# Patient Record
Sex: Male | Born: 1935 | Race: White | Hispanic: No | Marital: Married | Smoking: Former smoker
Health system: Southern US, Community
[De-identification: ages and names within clinical notes are randomized; demographics above are authoritative.]

## PROBLEM LIST (undated history)

## (undated) DIAGNOSIS — R51 Headache: Secondary | ICD-10-CM

## (undated) DIAGNOSIS — N189 Chronic kidney disease, unspecified: Secondary | ICD-10-CM

## (undated) DIAGNOSIS — I200 Unstable angina: Secondary | ICD-10-CM

## (undated) DIAGNOSIS — E785 Hyperlipidemia, unspecified: Secondary | ICD-10-CM

## (undated) DIAGNOSIS — M1990 Unspecified osteoarthritis, unspecified site: Secondary | ICD-10-CM

## (undated) DIAGNOSIS — R079 Chest pain, unspecified: Secondary | ICD-10-CM

## (undated) DIAGNOSIS — I129 Hypertensive chronic kidney disease with stage 1 through stage 4 chronic kidney disease, or unspecified chronic kidney disease: Secondary | ICD-10-CM

## (undated) DIAGNOSIS — I498 Other specified cardiac arrhythmias: Secondary | ICD-10-CM

## (undated) DIAGNOSIS — Z87891 Personal history of nicotine dependence: Secondary | ICD-10-CM

## (undated) DIAGNOSIS — E876 Hypokalemia: Secondary | ICD-10-CM

## (undated) DIAGNOSIS — I2510 Atherosclerotic heart disease of native coronary artery without angina pectoris: Secondary | ICD-10-CM

## (undated) DIAGNOSIS — N400 Enlarged prostate without lower urinary tract symptoms: Secondary | ICD-10-CM

## (undated) DIAGNOSIS — I10 Essential (primary) hypertension: Secondary | ICD-10-CM

## (undated) DIAGNOSIS — C801 Malignant (primary) neoplasm, unspecified: Secondary | ICD-10-CM

## (undated) DIAGNOSIS — R519 Headache, unspecified: Secondary | ICD-10-CM

## (undated) HISTORY — DX: Chest pain, unspecified: R07.9

## (undated) HISTORY — PX: OTHER SURGICAL HISTORY: SHX169

## (undated) HISTORY — PX: VASECTOMY: SHX75

## (undated) HISTORY — PX: EYE SURGERY: SHX253

## (undated) HISTORY — DX: Other specified cardiac arrhythmias: I49.8

## (undated) HISTORY — DX: Atherosclerotic heart disease of native coronary artery without angina pectoris: I25.10

## (undated) HISTORY — DX: Headache, unspecified: R51.9

## (undated) HISTORY — DX: Headache: R51

## (undated) HISTORY — DX: Unstable angina: I20.0

## (undated) HISTORY — DX: Personal history of nicotine dependence: Z87.891

## (undated) HISTORY — DX: Hypertensive chronic kidney disease with stage 1 through stage 4 chronic kidney disease, or unspecified chronic kidney disease: I12.9

## (undated) HISTORY — DX: Hyperlipidemia, unspecified: E78.5

## (undated) HISTORY — DX: Hypokalemia: E87.6

## (undated) HISTORY — DX: Chronic kidney disease, unspecified: N18.9

---

## 2005-03-27 ENCOUNTER — Ambulatory Visit: Payer: Self-pay | Admitting: Cardiology

## 2006-07-28 ENCOUNTER — Encounter: Payer: Self-pay | Admitting: Cardiology

## 2007-06-16 DIAGNOSIS — I2510 Atherosclerotic heart disease of native coronary artery without angina pectoris: Secondary | ICD-10-CM

## 2007-06-16 HISTORY — PX: CORONARY ANGIOPLASTY: SHX604

## 2007-06-16 HISTORY — DX: Atherosclerotic heart disease of native coronary artery without angina pectoris: I25.10

## 2008-04-09 ENCOUNTER — Encounter: Payer: Self-pay | Admitting: Cardiology

## 2008-04-10 ENCOUNTER — Encounter: Payer: Self-pay | Admitting: Cardiology

## 2008-04-25 ENCOUNTER — Encounter: Payer: Self-pay | Admitting: Cardiology

## 2008-04-25 ENCOUNTER — Encounter: Payer: Self-pay | Admitting: Physician Assistant

## 2008-04-25 ENCOUNTER — Ambulatory Visit: Payer: Self-pay | Admitting: Cardiology

## 2008-04-25 ENCOUNTER — Inpatient Hospital Stay (HOSPITAL_COMMUNITY): Admission: EM | Admit: 2008-04-25 | Discharge: 2008-04-27 | Payer: Self-pay | Admitting: Cardiology

## 2008-04-26 ENCOUNTER — Encounter: Payer: Self-pay | Admitting: Cardiology

## 2008-05-21 ENCOUNTER — Ambulatory Visit: Payer: Self-pay | Admitting: Cardiology

## 2008-05-28 ENCOUNTER — Encounter: Payer: Self-pay | Admitting: Physician Assistant

## 2008-07-25 ENCOUNTER — Encounter: Payer: Self-pay | Admitting: Cardiology

## 2008-07-25 ENCOUNTER — Ambulatory Visit: Payer: Self-pay | Admitting: Cardiology

## 2008-07-25 DIAGNOSIS — N182 Chronic kidney disease, stage 2 (mild): Secondary | ICD-10-CM | POA: Insufficient documentation

## 2008-07-25 DIAGNOSIS — I10 Essential (primary) hypertension: Secondary | ICD-10-CM | POA: Insufficient documentation

## 2008-07-25 DIAGNOSIS — I259 Chronic ischemic heart disease, unspecified: Secondary | ICD-10-CM

## 2008-08-01 ENCOUNTER — Encounter: Payer: Self-pay | Admitting: Cardiology

## 2008-11-29 ENCOUNTER — Encounter: Payer: Self-pay | Admitting: Cardiology

## 2009-02-19 ENCOUNTER — Ambulatory Visit: Payer: Self-pay | Admitting: Cardiology

## 2009-02-19 DIAGNOSIS — E785 Hyperlipidemia, unspecified: Secondary | ICD-10-CM

## 2009-04-10 ENCOUNTER — Telehealth (INDEPENDENT_AMBULATORY_CARE_PROVIDER_SITE_OTHER): Payer: Self-pay | Admitting: *Deleted

## 2009-04-10 ENCOUNTER — Encounter: Payer: Self-pay | Admitting: Cardiology

## 2009-04-11 ENCOUNTER — Encounter: Payer: Self-pay | Admitting: Cardiology

## 2009-06-19 ENCOUNTER — Encounter: Payer: Self-pay | Admitting: Cardiology

## 2009-07-19 ENCOUNTER — Encounter: Payer: Self-pay | Admitting: Physician Assistant

## 2009-07-26 ENCOUNTER — Encounter (INDEPENDENT_AMBULATORY_CARE_PROVIDER_SITE_OTHER): Payer: Self-pay | Admitting: *Deleted

## 2009-09-02 ENCOUNTER — Ambulatory Visit: Payer: Self-pay | Admitting: Cardiology

## 2010-03-25 ENCOUNTER — Ambulatory Visit: Payer: Self-pay | Admitting: Cardiology

## 2010-04-21 ENCOUNTER — Telehealth (INDEPENDENT_AMBULATORY_CARE_PROVIDER_SITE_OTHER): Payer: Self-pay | Admitting: *Deleted

## 2010-05-05 ENCOUNTER — Encounter: Payer: Self-pay | Admitting: Cardiology

## 2010-06-24 ENCOUNTER — Encounter: Payer: Self-pay | Admitting: Cardiology

## 2010-06-27 ENCOUNTER — Encounter: Payer: Self-pay | Admitting: Cardiology

## 2010-07-15 NOTE — Letter (Signed)
Summary: Engineer, materials at [facility]  [address]   [place], [place] [address]   Phone: [phone]  Fax: [phone]        July 26, 2009 MRN: [id number]   [facility] [address] [place], [place]  [address]   Dear Mr. WORD,  Your test ordered by Selena Batten has been reviewed by your physician (or physician assistant) and was found to be normal or stable. Your physician (or physician assistant) felt no changes were needed at this time.  ____ Echocardiogram  ____ Cardiac Stress Test  __X__ Lab Work  ____ Peripheral vascular study of arms, legs or neck  ____ CT scan or X-ray  ____ Lung or Breathing test  ____ Other:   Thank you.   Hoover Brunette, LPN    Duane Boston, M.D., F.A.C.C. Thressa Sheller, M.D., F.A.C.C. Oneal Grout, M.D., F.A.C.C. Cheree Ditto, M.D., F.A.C.C. Daiva Nakayama, M.D., F.A.C.C. Kenney Houseman, M.D., F.A.C.C. Jeanne Ivan, PA-C

## 2010-07-15 NOTE — Assessment & Plan Note (Signed)
Summary: 6 MO FU REMINDER-SRS  Medications Added ASPIRIN 325 MG TABS (ASPIRIN) take one tablet by mouth once a day        Visit Type:  Follow-up Primary Provider:  Dr Dimas Aguas  CC:  follow-up visit.  History of Present Illness: the patient is 77 year old male with history of two-vessel coronary disease status post stent placement. The patient has normal LV function. He's currently taking care of his wife who has cancer.  The patient denies any chest pain. He has no orthopnea PND he has no palpitations or syncope. He remained stable from a cardiovascular perspective. Lipid panels followed by Dr. Dimas Aguas.this  His blood pressure poorly controlled today in the office. He reports that his readings have been good at home. The patient just returned from a four-hour trip to the hospital for a bone scan for his wife.  Clinical Review Panels:  Cardiac Imaging Cardiac Cath Findings severe circumflex and right coronary stenosis. Normal right heart where pressures. Patient underwent U. thus position the right coronary artery and circumflex by Dr. Excell Seltzer (04/27/2008)    Preventive Screening-Counseling & Management  Alcohol-Tobacco     Smoking Status: quit     Year Quit: 1995  Current Medications (verified): 1)  Flomax 0.4 Mg Xr24h-Cap (Tamsulosin Hcl) .... Every 3-4 Days 2)  Atenolol 50 Mg Tabs (Atenolol) .... One Tablet P.o. Q. Day 3)  Aspirin 81 Mg Tbec (Aspirin) .... Take One Tablet By Mouth Daily 4)  Hydrochlorothiazide 25 Mg Tabs (Hydrochlorothiazide) .... Take One Tablet By Mouth Daily. 5)  Lisinopril 20 Mg Tabs (Lisinopril) .... One Tablet P.o. Q. Day 6)  Nitroglycerin 0.4 Mg Subl (Nitroglycerin) .... One Tablet Under Tongue Every 5 Minutes As Needed For Chest Pain---May Repeat Times Three 7)  Simvastatin 40 Mg Tabs (Simvastatin) .... Take 1 Tablet By Mouth Once A Day Have Flp/lft in 12wks of Starting 8)  Aspirin 325 Mg Tabs (Aspirin) .... Take One Tablet By Mouth Once A  Day  Allergies: 1)  Penicillin  Comments:  Nurse/Medical Assistant: The patient's medications and allergies were reviewed with the patient and were updated in the Medication and Allergy Lists. Youlanda Mighty RN (September 02, 2009 2:46 PM)  Past History:  Past Medical History: Last updated: 02/19/2009 2 vessel coronary artery disease Status post  stenting of the circumflex and right coronary artery Left ventriculogram deferred History of preserved LV function Mild chronic renal insufficiency creatinine 1.3 Hypertension uncontrolled Dyslipidemia Mild hypokalemia  Family History: Last updated: 07/25/2008 noncontributory  Social History: Last updated: 07/25/2008 the patient does not smoke or drink he is married  Risk Factors: Smoking Status: quit (09/02/2009)  Social History: Smoking Status:  quit  Vital Signs:  Patient profile:   77 year old male Height:      68 inches Weight:      181 pounds Pulse rate:   52 / minute BP sitting:   172 / 81  (left arm) Cuff size:   regular  Vitals Entered By: Youlanda Mighty RN (September 02, 2009 2:41 PM) CC: follow-up visit   Physical Exam  Additional Exam:  General: Well-developed, well-nourished in no distress head: Normocephalic and atraumatic eyes PERRLA/EOMI intact, conjunctiva and lids normal nose: No deformity or lesions mouth normal dentition, normal posterior pharynx neck: Supple, no JVD.  No masses, thyromegaly or abnormal cervical nodes lungs: Normal breath sounds bilaterally without wheezing.  Normal percussion heart: regular rate and rhythm with normal S1 and S2, no S3 or S4.  PMI is normal.  No  pathological murmurs abdomen: Normal bowel sounds, abdomen is soft and nontender without masses, organomegaly or hernias noted.  No hepatosplenomegaly musculoskeletal: Back normal, normal gait muscle strength and tone normal pulsus: Pulse is normal in all 4 extremities Extremities: No peripheral pitting  edema neurologic: Alert and oriented x 3 skin: Intact without lesions or rashes cervical nodes: No significant adenopathy psychologic: Normal affect    Impression & Recommendations:  Problem # 1:  ESSENTIAL HYPERTENSION, BENIGN (ICD-401.1) blood pressure is poorly controlled today but the patient reassures me that his readings are otherwise within normal range. He just returned from the hospital with his wife. He states he was stressed out because of the long stay. I did ask him to check his blood pressures in the next couple of days carefully. His updated medication list for this problem includes:    Atenolol 50 Mg Tabs (Atenolol) ..... One tablet p.o. q. day    Aspirin 81 Mg Tbec (Aspirin) .Marland Kitchen... Take one tablet by mouth daily    Hydrochlorothiazide 25 Mg Tabs (Hydrochlorothiazide) .Marland Kitchen... Take one tablet by mouth daily.    Lisinopril 20 Mg Tabs (Lisinopril) ..... One tablet p.o. q. day    Aspirin 325 Mg Tabs (Aspirin) .Marland Kitchen... Take one tablet by mouth once a day  Problem # 2:  CORONARY ARTERY DISEASE, S/P PTCA (ICD-414.9) patient reports no recurrent chest pain. It is no clinical evidence of ischemia. Plavix has been discontinued. The following medications were removed from the medication list:    Plavix 75 Mg Tabs (Clopidogrel bisulfate) ..... One tablet p.o. q. day His updated medication list for this problem includes:    Atenolol 50 Mg Tabs (Atenolol) ..... One tablet p.o. q. day    Aspirin 81 Mg Tbec (Aspirin) .Marland Kitchen... Take one tablet by mouth daily    Lisinopril 20 Mg Tabs (Lisinopril) ..... One tablet p.o. q. day    Nitroglycerin 0.4 Mg Subl (Nitroglycerin) ..... One tablet under tongue every 5 minutes as needed for chest pain---may repeat times three    Aspirin 325 Mg Tabs (Aspirin) .Marland Kitchen... Take one tablet by mouth once a day  Problem # 3:  RENAL DISEASE, CHRONIC, STAGE II (ICD-585.2) Assessment: Comment Only  Patient Instructions: 1)  Your physician recommends that you continue on  your current medications as directed. Please refer to the Current Medication list given to you today. 2)  Follow up in  6 months

## 2010-07-15 NOTE — Progress Notes (Signed)
Summary: clearance for cataract surgery    Phone Note Other Incoming Call back at [phone]   Caller: PATIENT WALK-IN Summary of Call: Requesting clearance for cataract surgery with Dr. Ruben Gottron - Optometrist at [facility].   Hoover Brunette, LPN  April 21, 2010 2:22 PM   Follow-up for Phone Call        Patient can be cleared for cataract surgery- low risk.  Follow-up by: Lewayne Bunting, MD, [facility],  April 21, 2010 4:45 PM  Additional Follow-up for Phone Call Additional follow up Details #1::        Patient notified.   States he has appt. with surgeon in the morning for consult.  Asked him to have them fax [place] a clearance request and I will fax back.  Patient verbalized understanding.  Hoover Brunette, LPN  April 22, 2010 2:45 PM     Additional Follow-up for Phone Call Additional follow up Details #2::    Spoke with patient.  Have not received anything yet.  Patient will call them to request they fax again. Hoover Brunette, LPN  May 02, 2010 9:48 AM   Form received.  Will fax back today. Hoover Brunette, LPN  May 05, 2010 11:01 AM

## 2010-07-15 NOTE — Letter (Signed)
Summary: External Correspondence/ OFFICE NOTE DR. HOWARD  External Correspondence/ OFFICE NOTE DR. HOWARD   Imported By: Dorise Hiss 06/25/2009 11:31:51  _____________________________________________________________________  External Attachment:    Type:   Image     Comment:   External Document

## 2010-07-15 NOTE — Assessment & Plan Note (Signed)
Summary: 6 mo fu per sept reminder -srs  Medications Added FLOMAX 0.4 MG XR24H-CAP (TAMSULOSIN HCL) every 2-3 days      Allergies Added:   Visit Type:  Follow-up Primary Provider:  Dr Dimas Aguas   History of Present Illness: the patient is a 77 year old male with history of two-vessel coronary arteries status post stent placement. He has normal LV function. Unfortunately his wife died of lung cancer several months ago. He is quite tearful.  From a cardiovascular standpoint he is doing well. He denies any chest pain shortness of breath orthopnea PND. Health care maintenance as well as monitoring of his lipid panels as provided by Dr. Dimas Aguas. Last office visit he was hypertensive and his blood pressure is now under control.  The patient denies any chest pain, palpitations or syncope  Preventive Screening-Counseling & Management  Alcohol-Tobacco     Smoking Status: quit     Year Quit: 1995  Current Medications (verified): 1)  Flomax 0.4 Mg Xr24h-Cap (Tamsulosin Hcl) .... Every 2-3 Days 2)  Atenolol 50 Mg Tabs (Atenolol) .... One Tablet P.o. Q. Day 3)  Aspirin 81 Mg Tbec (Aspirin) .... Take One Tablet By Mouth Daily 4)  Hydrochlorothiazide 25 Mg Tabs (Hydrochlorothiazide) .... Take One Tablet By Mouth Daily. 5)  Lisinopril 20 Mg Tabs (Lisinopril) .... One Tablet P.o. Q. Day 6)  Nitroglycerin 0.4 Mg Subl (Nitroglycerin) .... One Tablet Under Tongue Every 5 Minutes As Needed For Chest Pain---May Repeat Times Three 7)  Simvastatin 40 Mg Tabs (Simvastatin) .... Take 1 Tablet By Mouth Once A Day Have Flp/lft in 12wks of Starting 8)  Aspirin 325 Mg Tabs (Aspirin) .... Take One Tablet By Mouth Once A Day  Allergies (verified): 1)  Penicillin  Comments:  Nurse/Medical Assistant: The patient's medication bottles and allergies were reviewed with the patient and were updated in the Medication and Allergy Lists.  Past History:  Family History: Last updated:  07/25/2008 noncontributory  Social History: Last updated: 07/25/2008 the patient does not smoke or drink he is married  Risk Factors: Smoking Status: quit (03/25/2010)  Past Medical History: 2 vessel coronary artery disease Status post  stenting of the circumflex and right coronary artery2009 Left ventriculogram deferred History of preserved LV function Mild chronic renal insufficiency creatinine 1.3 Hypertension uncontrolled Dyslipidemia Mild hypokalemia  Vital Signs:  Patient profile:   77 year old male Height:      68 inches Weight:      177 pounds BMI:     27.01 Pulse rate:   62 / minute BP sitting:   135 / 70  (left arm) Cuff size:   regular  Vitals Entered By: Carlye Grippe (March 25, 2010 9:31 AM)  Nutrition Counseling: Patient's BMI is greater than 25 and therefore counseled on weight management options.  Physical Exam  Additional Exam:  General: Well-developed, well-nourished in no distress head: Normocephalic and atraumatic eyes PERRLA/EOMI intact, conjunctiva and lids normal nose: No deformity or lesions mouth normal dentition, normal posterior pharynx neck: Supple, no JVD.  No masses, thyromegaly or abnormal cervical nodes lungs: Normal breath sounds bilaterally without wheezing.  Normal percussion heart: regular rate and rhythm with normal S1 and S2, no S3 or S4.  PMI is normal.  No pathological murmurs abdomen: Normal bowel sounds, abdomen is soft and nontender without masses, organomegaly or hernias noted.  No hepatosplenomegaly musculoskeletal: Back normal, normal gait muscle strength and tone normal pulsus: Pulse is normal in all 4 extremities Extremities: No peripheral pitting edema neurologic: Alert and  oriented x 3 skin: Intact without lesions or rashes cervical nodes: No significant adenopathy psychologic: Normal affect    EKG  Procedure date:  03/25/2010  Findings:      normal sinus rhythm no acute abnormalities  Impression &  Recommendations:  Problem # 1:  DYSLIPIDEMIA (ICD-272.4) followed by Dr. Dimas Aguas His updated medication list for this problem includes:    Simvastatin 40 Mg Tabs (Simvastatin) .Marland Kitchen... Take 1 tablet by mouth once a day have flp/lft in 12wks of starting  Problem # 2:  ESSENTIAL HYPERTENSION, BENIGN (ICD-401.1) blood pressure controlled continue current medical regimen His updated medication list for this problem includes:    Atenolol 50 Mg Tabs (Atenolol) ..... One tablet p.o. q. day    Aspirin 81 Mg Tbec (Aspirin) .Marland Kitchen... Take one tablet by mouth daily    Hydrochlorothiazide 25 Mg Tabs (Hydrochlorothiazide) .Marland Kitchen... Take one tablet by mouth daily.    Lisinopril 20 Mg Tabs (Lisinopril) ..... One tablet p.o. q. day    Aspirin 325 Mg Tabs (Aspirin) .Marland Kitchen... Take one tablet by mouth once a day  Problem # 3:  CORONARY ARTERY DISEASE, S/P PTCA (ICD-414.9) no recurrent chest pain. Status post coronary intervention in 2009 to the right coronary artery and circumflex. The patient has not required any nitroglycerin. His electrocardiogram is within normal limits. His updated medication list for this problem includes:    Atenolol 50 Mg Tabs (Atenolol) ..... One tablet p.o. q. day    Aspirin 81 Mg Tbec (Aspirin) .Marland Kitchen... Take one tablet by mouth daily    Lisinopril 20 Mg Tabs (Lisinopril) ..... One tablet p.o. q. day    Nitroglycerin 0.4 Mg Subl (Nitroglycerin) ..... One tablet under tongue every 5 minutes as needed for chest pain---may repeat times three    Aspirin 325 Mg Tabs (Aspirin) .Marland Kitchen... Take one tablet by mouth once a day  Orders: EKG w/ Interpretation (93000)  Patient Instructions: 1)  Your physician recommends that you continue on your current medications as directed. Please refer to the Current Medication list given to you today. 2)  Follow up in  1 year

## 2010-07-15 NOTE — Letter (Signed)
Summary: Letter/ [facility]  Letter/ [facility]   Imported By: Dorise Hiss 05/14/2010 10:30:54  _____________________________________________________________________  External Attachment:    Type:   Image     Comment:   External Document

## 2010-07-17 NOTE — Letter (Signed)
Summary: External Correspondence/ OFFICE NOTE DR. HOWARD  External Correspondence/ OFFICE NOTE DR. HOWARD   Imported By: Dorise Hiss 07/03/2010 14:15:55  _____________________________________________________________________  External Attachment:    Type:   Image     Comment:   External Document

## 2010-10-28 NOTE — Cardiovascular Report (Signed)
[name], [name]             ACCOUNT NO.:  [id number]   MEDICAL RECORD NO.:  [id number]          PATIENT TYPE:  INP   LOCATION:  6524                         FACILITY:  [facility]   PHYSICIAN:  Rollene Rotunda, MD, FACCDATE OF BIRTH:  06-Mar-1928   DATE OF PROCEDURE:  04/25/2008  DATE OF DISCHARGE:                            CARDIAC CATHETERIZATION   PRIMARY CARE PHYSICIAN:  Kirstie Peri, MD   CARDIOLOGIST:  Everardo Beals. Juanda Chance, MD, [facility]   PROCEDURE:  Left and right heart catheterization/ coronary  arteriography.   INDICATIONS:  The patient with dyspnea and chest pains suggestive of  unstable angina versus heart failure.   PROCEDURE NOTE:  Left heart catheterization was performed in the right  femoral artery and right heart catheterization was performed via the  right femoral vein.  Both vessels are cannulated using anterior wall  puncture.  A #5-French arterial sheath and a #7-French venous sheath  were inserted via the modified Seldinger technique.  Preformed Judkins,  pigtail, and Swan-Ganz catheter utilized.  The patient tolerated the  procedure well and left the lab in stable condition.   RESULTS:  Hemodynamics:  RA mean 1; RV 12/2; PA 70, #1 with a mean of 7;  pulmonary capillary pressure mean 4; LV 120/7; and AO 120/60.   Coronaries:  Left main had 25% stenosis.  The LAD had proximal 25%  followed by tandem mid 25% lesion.  The LAD wrapped the apex.  There was  small first diagonal, which was free of high-grade disease.  A moderate-  sized second diagonal had a long ostial 30% stenosis.  The circumflex in  the AV groove had diffuse luminal irregularities.  The distal vessel  before a large second posterolateral had a focal 99% stenosis.  First  obtuse marginal was large with ostial 25% stenosis.  Second obtuse  marginal was moderate-sized with ostial 40% stenosis.  First  posterolateral was moderate size and normal.  Second posterolateral as  described, was compromised by the  99% lesion in the AV groove medially  proximal to the takeoff of this vessel.  The vessel was large without  high-grade stenosis.  Right coronary artery is a dominant vessel.  There  was a long proximal 50% stenosis, terminating as a long 80% lesion  before the RV branch.  There was a PDA, which was large and normal.   Left ventriculogram:  A left ventricle was not injected secondary to  some mild renal insufficiency.   CONCLUSION:  Severe circumflex and right coronary artery stenosis.  Normal right heart and pulmonary pressures.  It certainly seems that his  dyspnea is an anginal equivalent.  He does have chest discomfort.   PLAN:  Based on the above.  The patient who had percutaneous  revascularization of the right coronary artery and circumflex by Dr.  Excell Seltzer.  He will have aggressive secondary risk reduction.      Rollene Rotunda, MD, [facility]  Electronically Signed     JH/MEDQ  D:  04/26/2008  T:  04/26/2008  Job:  [id number]   cc:   Kirstie Peri, MD

## 2010-10-28 NOTE — Cardiovascular Report (Signed)
NAMEHASHEM, GOYNES             ACCOUNT NO.:  [id number]   MEDICAL RECORD NO.:  [id number]          PATIENT TYPE:  INP   LOCATION:  6524                         FACILITY:  [facility]   PHYSICIAN:  Veverly Fells. Excell Seltzer, MD  DATE OF BIRTH:  05-13-1928   DATE OF PROCEDURE:  04/26/2008  DATE OF DISCHARGE:  04/27/2008                            CARDIAC CATHETERIZATION   PROCEDURE:  1. Percutaneous transluminal coronary angioplasty and stenting of the      mid left circumflex.  2. Percutaneous transluminal coronary angioplasty and stenting of the      mid right coronary artery.   INDICATIONS:  Henry Hudson is a 77 year old gentleman who presented with  symptoms that were worrisome for unstable angina.  He underwent  diagnostic catheterization by Dr. Antoine Poche.  This demonstrated severe  two-vessel coronary artery disease.  He has a critical stenosis of the  mid to distal left circumflex leading into a large posterolateral  branch.  There is a 95-99% stenosis originating just after an OM branch.  There is also moderately tight stenosis of the proximal and mid right  coronary artery with a 75% long lesion in that area.  Both areas  appeared amenable to PCI.  The patient was treated with 600 mg of Plavix  on the table.  Angiomax was used for anticoagulation.  A 6-French XB 3.5-  cm guide catheter was used to treat the circumflex lesion.  This lesion  was treated first as it appeared to most critical.  A cougar guidewire  was used and the lesion was crossed without much difficulty.  The vessel  was predilated with a 2.5 x 12 mm apex balloon which was taken to 8  atmospheres.  There was improvement and there was good balloon expansion  and improvement in the vessel following balloon dilatation.  The lesion  appeared fairly focal and I elected to stent it with a 3.0 x 15 mm  Promus stent.  This was carefully positioned and deployed at 12  atmospheres.  The stent was well expanded.  There was an  excellent  result.  A 3.0 x 12 mm Voyager Peter balloon was then used for post-  dilatation and then was taken to 14 atmospheres distally and 16  atmospheres proximally.  At the completion of the procedure, there was  TIMI III flow in the vessel and a well-expanded stent.  There was no  side branch compromise.  At that point, attention was turned to the  right coronary artery.  A JR-4 guide catheter was used.  The same cougar  guidewire was used to cross the area of severe disease in the proximal  to mid vessel.  The vessel was predilated with a 2.0 x 20 mm Voyager  balloon which was taken to 10 atmospheres.  The balloon was left in  place and angiography was performed in order to assess lesion length.  The lesion appeared long and 2.5 x 28 mm Promus stent was chosen in  order to cover the entire segment of significant disease.  The stent was  deployed at 16 atmospheres and appeared well expanded.  Following  stenting, there was a good angiographic result.  The stent appeared  somewhat undersized.  A 3.0 x 15 mm Voyager Proctor balloon was used to post-  dilate and it was taken to 16 atmospheres on 2 inflations.  At the  completion of the procedure, there was excellent stent expansion with  TIMI III flow in the vessel.  The patient tolerated the entire procedure  well.  There were no immediate complications.  He was transferred to the  recovery area in stable condition.   ASSESSMENT:  Successful two-vessel percutaneous coronary intervention  using single drug-eluting stents in the distal left circumflex and  proximal to mid right coronary artery.   RECOMMENDATIONS:  I recommend a minimum of 12 months' dual antiplatelet  therapy with aspirin and Plavix.      Veverly Fells. Excell Seltzer, MD  Electronically Signed     MDC/MEDQ  D:  06/15/2008  T:  06/15/2008  Job:  [id number]

## 2010-10-28 NOTE — Assessment & Plan Note (Signed)
[facility]                          [facility] OFFICE NOTE   AYOUB, AREY                    MRN:          [id number]  DATE:05/21/2008                            DOB:          May 19, 1928    PRIMARY CARDIOLOGIST:  Learta Codding, MD, [facility]   REASON FOR VISIT:  Post-hospital followup.   Mr. Sedivy returns to our clinic, following recent consultation by [place]  here at [facility] for symptoms worrisome for unstable angina  pectoris.  He presented with no prior history of CAD, but with numerous  cardiac risk factors as well as an abnormal stress Cardiolite in 2006,  for which he opted for medical therapy.  During this recent admission,  however, his symptoms were suggestive of unstable angina pectoris, but  with initial cardiac markers negative, and we referred him to [facility]  for cardiac catheterization.   Specifically, there was a 99% distal CFX lesion as well as a long, 80%  RCA stenosis.  LAD had no significant disease.  LV gram was deferred,  secondary to mild renal insufficiency.   The patient underwent successful treatment with Promus drug-eluting  stents to both the CFX and RCA lesions.  He was discharged the following  morning.   Clinically, he has not had any of the symptoms which preceded his recent  hospitalization.  In fact, he tells me that he feels a difference.  He  has not had any complications of the right groin incision site.   CURRENT MEDICATIONS:  1. Plavix 75 daily.  2. Aspirin 81 daily.  3. Lipitor 20 daily.  4. Atenolol 50 daily.  5. Flomax.   PHYSICAL EXAMINATION:  VITAL SIGNS:  Blood pressure 182/92, pulse 80 and  regular, weight 189.  GENERAL:  A 77 year old male sitting upright in no distress.  HEENT:  Normocephalic and atraumatic.  NECK:  Palpable bilateral pulses without bruits; no JVD.  LUNGS:  Clear to auscultation in all fields.  HEART:  Regular rate and rhythm.  No significant murmurs.   No rubs.  ABDOMEN:  Soft and nontender.  Right groin stable with no hematoma,  ecchymosis, or bruit to auscultation.  EXTREMITIES:  Intact femoral and distal pulses.  NEUROLOGIC:  No focal deficit.   IMPRESSION:  1. Severe two-vessel coronary artery disease.      a.     Status post recent Promus stenting of circumflex and right       coronary artery.      b.     Left ventriculogram deferred.  2. History of preserved left ventricular function.  3. Mild, chronic renal insufficiency.  4. Hypertension, uncontrolled.  5. Dyslipidemia.  6. Recent, mild hypokalemia.   PLAN:  1. Resume hydrochlorothiazide at previous dose of 25 mg daily, for      aggressive blood pressure management.  However, we will continue to      closely monitor his electrolytes, in light of recent hypokalemia.      We will schedule a followup BMET in 1 week.  We will also repeat      blood pressure at that time,  as well.  We may need to add another      agent to his current regimen, if his blood pressure is not      adequately controlled.  2. The patient will be due for a followup of FLP/LFT profile in 2      months.  3. Schedule return clinic followup with myself and Dr. Andee Lineman in 2      months.      Rozell Searing, PA-C  Electronically Signed      Jonelle Sidle, MD  Electronically Signed   GS/MedQ  DD: 05/21/2008  DT: 05/22/2008  Job #: [id number]   cc:   Kirstie Peri, MD

## 2010-10-28 NOTE — Discharge Summary (Signed)
NAMESAYER, MASINI             ACCOUNT NO.:  [id number]   MEDICAL RECORD NO.:  [id number]          PATIENT TYPE:  INP   LOCATION:  6524                         FACILITY:  [facility]   PHYSICIAN:  Rollene Rotunda, MD, FACCDATE OF BIRTH:  07-Nov-1927   DATE OF ADMISSION:  04/25/2008  DATE OF DISCHARGE:  04/27/2008                               DISCHARGE SUMMARY   PRIMARY CARDIOLOGIST:  Dr. __________ Natasha Bence.   PRIMARY CARE PHYSICIAN:  Kirstie Peri, MD, in [place].   PROCEDURE PERFORMED DURING HOSPITALIZATIONS:  1. Cardiac catheterization, this was completed by Dr. Rollene Rotunda      on April 26, 2008, revealing severe circumflex and right      coronary artery stenosis with normal right heart pressure.  2. Successful PCI in left circumflex and right coronary artery with      Promus drug-eluting stent per Dr. Veverly Fells. Cooper on April 26, 2008, with successful PCI using a drug-eluting stent 3.0 x 15 mm      Promus to the circumflex and a 2.5 mm x 28 mm Promus drug-eluting      stent to the right coronary artery.   FINAL DISCHARGE DIAGNOSES:  1. Coronary artery disease.  A:  Status post cardiac catheterization with percutaneous coronary  intervention to the left circumflex and right coronary artery using  Promus drug-eluting stent on April 26, 2008.  1. Abnormal cardiac stress test in 2006 with questionable inferior      ischemia treated medically.  2. Hypertension.  3. Hyperlipidemia.  4. Hypokalemia.  5. Mild chronic renal insufficiency.  6. Sinus bradycardia.   HOSPITAL COURSE:  This a 77 year old Caucasian male with no prior  history of coronary artery disease who presented to the emergency room  at [facility] on April 25, 2008, with complaints of  chest pain with initial cardiac enzymes being normal and an EKG showing  no acute changes.  He was seen and examined by Fayrene Fearing __________  physician assistant and Dr. Andee Lineman.  The patient was found to be  mildly  hypokalemic and his hydrochlorothiazide was discontinued.  The patient  was started on intravenous heparin and statin and was transferred to  [facility] for cardiac catheterization in the setting of  recurrent chest pain.  The patient did have potassium repleted and was  therefore stable for cardiac catheterization.   The patient was seen and examined by Dr. Charlies Constable on day of  admission and cardiac catheterization was planned.  This was completed  by Dr. Rollene Rotunda revealing severe circumflex and right coronary  artery stenosis, the distal circumflex had 95% stenosis and this was  intervened on by Dr. Tonny Bollman revealing successful PCI using  Promus drug-eluting stents to both circumflex and right coronary artery.  The patient tolerated the procedure well without evidence of bleeding,  hematoma, signs of infection, recurring chest pain.  The patient was  placed on aspirin, Plavix, and Lipitor.  The patient was seen and  examined by Dr. Tonny Bollman on day of discharge and found to be  stable and anxious to return  home.  Followup labs were normal and the  patient was discharged.   DISCHARGE LABS:  Hemoglobin 12.9, hematocrit 38.0, white blood cells  12.9, platelets 182.  Sodium 139, potassium 4.1, chloride 106, CO2 26,  BUN 17, creatinine 1.1, glucose 111.  Telemetry revealing normal sinus  rhythm.  Hemoglobin A1c was 5.7, troponin is 0.01, total cholesterol  147, total triglycerides 121, HDL 28, LDL 95.   DISCHARGE VITAL SIGNS:  Blood pressure 127/63, heart rate 65,  respirations 18, temperature 97.4, O2 sat 97% on room air.   DISCHARGE MEDICATIONS:  1. Atenolol 50 mg daily.  2. Flomax 0.4 mg every 3-4 days.  3. Aspirin 325 mg daily.  4. Lipitor 20 mg daily (new prescription provided).  5. Plavix 75 mg daily (new prescription provided).  6. Nitroglycerin 0.4 mg as needed for chest pain (new prescription      provided).  7. THE PATIENT HAS BEEN  ADVISED TO STOP HYDROCHLOROTHIAZIDE.   FOLLOWUP PLANS AND APPOINTMENTS:  1. The patient will follow up with Dr. Andee Lineman in [place].  Our office      will call as this is early in the morning, and our office is not      open to make appointment.  2. The patient will need to have followup lipids and LFTs in      approximately 4 weeks.  3. The patient has been given post cardiac catheterization      instructions with particular emphasis on the right groin site for      evidence of bleeding, hematoma, and infection.  4. The patient has been advised, new prescriptions provided to include      Plavix, Lipitor, and nitroglycerin.  5. The patient will follow up with Dr. Sherryll Burger primary care physician for      continued medical management.   TIMES SPENT WITH THE PATIENT TO INCLUDE THE PHYSICIAN TIME:  35 minutes.      Bettey Mare. Lyman Bishop, NP      Rollene Rotunda, MD, [facility]  Electronically Signed    KML/MEDQ  D:  04/27/2008  T:  04/27/2008  Job:  [id number]

## 2011-03-17 LAB — CARDIAC PANEL(CRET KIN+CKTOT+MB+TROPI)
CK, MB: 1.4
Total CK: 60
Troponin I: <0.01

## 2011-03-17 LAB — POCT I-STAT 3, VENOUS BLOOD GAS (G3P V)
TCO2: 25
pCO2, Ven: 41 — ABNORMAL LOW
pH, Ven: 7.376 — ABNORMAL HIGH

## 2011-03-17 LAB — POCT I-STAT 3, ART BLOOD GAS (G3+)
O2 Saturation: 98
pCO2 arterial: 36.9
pO2, Arterial: 96

## 2011-03-17 LAB — BASIC METABOLIC PANEL
Calcium: 8.8
Calcium: 8.9
Chloride: 106
Creatinine, Ser: 1.13
GFR calc Af Amer: >60
GFR calc Af Amer: >60
GFR calc non Af Amer: >60
GFR calc non Af Amer: >60
Sodium: 141

## 2011-03-17 LAB — CBC
Hemoglobin: 12.8 — ABNORMAL LOW
RBC: 3.94 — ABNORMAL LOW
RBC: 3.95 — ABNORMAL LOW
WBC: 10.6 — ABNORMAL HIGH
WBC: 12.9 — ABNORMAL HIGH

## 2011-03-17 LAB — LIPID PANEL
Cholesterol: 147
Total CHOL/HDL Ratio: 5.3
VLDL: 24

## 2011-03-17 LAB — HEPARIN LEVEL (UNFRACTIONATED)
Heparin Unfractionated: 0.72 — ABNORMAL HIGH
Heparin Unfractionated: <0.1 — ABNORMAL LOW

## 2011-04-13 ENCOUNTER — Encounter: Payer: Self-pay | Admitting: Cardiology

## 2011-04-14 ENCOUNTER — Ambulatory Visit (INDEPENDENT_AMBULATORY_CARE_PROVIDER_SITE_OTHER): Payer: Medicare Other | Admitting: Cardiology

## 2011-04-14 ENCOUNTER — Telehealth: Payer: Self-pay | Admitting: Nurse Practitioner

## 2011-04-14 ENCOUNTER — Encounter: Payer: Self-pay | Admitting: Cardiology

## 2011-04-14 ENCOUNTER — Telehealth: Payer: Self-pay | Admitting: *Deleted

## 2011-04-14 VITALS — BP 186/80 | HR 53 | Ht 68.0 in | Wt 179.0 lb

## 2011-04-14 DIAGNOSIS — I10 Essential (primary) hypertension: Secondary | ICD-10-CM

## 2011-04-14 DIAGNOSIS — N182 Chronic kidney disease, stage 2 (mild): Secondary | ICD-10-CM

## 2011-04-14 DIAGNOSIS — I2510 Atherosclerotic heart disease of native coronary artery without angina pectoris: Secondary | ICD-10-CM

## 2011-04-14 DIAGNOSIS — E785 Hyperlipidemia, unspecified: Secondary | ICD-10-CM

## 2011-04-14 MED ORDER — CARVEDILOL PHOSPHATE ER 20 MG PO CP24: 20.0000 mg | ORAL_CAPSULE | Freq: Every day | ORAL | Status: DC

## 2011-04-14 NOTE — Telephone Encounter (Signed)
Pt called back this evening wondering if dr. Andee Lineman has decided whether or not his coreg cr 20 daily could be switched to generic.  i advised that there is a phone note in the system from his call earlier today awaiting dr. degent's review and that he can expect to be contacted in the am.  He was grateful for the call back.

## 2011-04-14 NOTE — Patient Instructions (Signed)
Your physician wants you to follow-up in: 6 months. You will receive a reminder letter in the mail one-two months in advance. If you don't receive a letter, please call our office to schedule the follow-up appointment. Stop Atenolol Start Coreg CR 20 mg daily. Keep BP diary and call, fax or bring these readings to the office in 7-10 days.

## 2011-04-14 NOTE — Telephone Encounter (Signed)
Message left on nurse's voicemail that new prescription(coreg 20mg ) is too expensive,and he want the generic. Please advise.

## 2011-04-15 MED ORDER — CARVEDILOL 6.25 MG PO TABS: 6.2500 mg | ORAL_TABLET | Freq: Two times a day (BID) | ORAL | Status: DC

## 2011-04-15 NOTE — Assessment & Plan Note (Signed)
Blood pressure is poorly controlled. We will stop atenolol and changed to carvedilol. I told the patient that I would give him prescription for Coreg CR 20 minutes by mouth daily but if this was too expensive we would give him twice a day dosing with the generic preparation.

## 2011-04-15 NOTE — Telephone Encounter (Signed)
Patient informed and new prescription called to [facility]. Carvedilol 6.25mg  bid.

## 2011-04-15 NOTE — Assessment & Plan Note (Signed)
Followed by Dr. Howard. 

## 2011-04-15 NOTE — Telephone Encounter (Signed)
Yes , I had discusssed with him, this maybe a problem. Change to carvedilol (generic) 6.25 mg PO BID

## 2011-04-15 NOTE — Assessment & Plan Note (Signed)
No recurrent chest pain. Continue current medical therapy 

## 2011-04-15 NOTE — Telephone Encounter (Signed)
Please take care of this this morning- thanks

## 2011-04-15 NOTE — Progress Notes (Signed)
HPI: The patient is a 77 year old male with a history of two-vessel coronary artery disease status post stent placement in 2009. He has normal LV function. He denies any chest pain shortness of breath orthopnea PND. He appears to be doing well from a cardiovascular perspective. He has a history of hypertension and his blood pressure is poorly controlled. His lipid panel is followed by his primary care physician. He reports no other significant cardiovascular complaints. In particular the patient remains very active and has experienced no limitations in his exercise tolerance.  PMH: reviewed and listed in Problem List in Electronic Records (and see below)  Allergies/SH/FHX : available in Electronic Records for review  Medications: Current Outpatient Prescriptions on File Prior to Visit  Medication Sig Dispense Refill  . aspirin 325 MG tablet Take 325 mg by mouth daily.        . hydrochlorothiazide (HYDRODIURIL) 25 MG tablet Take 25 mg by mouth daily.        Marland Kitchen lisinopril (PRINIVIL,ZESTRIL) 20 MG tablet Take 20 mg by mouth daily.        . nitroGLYCERIN (NITROSTAT) 0.4 MG SL tablet Place 0.4 mg under the tongue every 5 (five) minutes as needed.        . simvastatin (ZOCOR) 40 MG tablet Take 40 mg by mouth at bedtime.        . Tamsulosin HCl (FLOMAX) 0.4 MG CAPS Take 0.4 mg by mouth as directed.          ROS: No nausea or vomiting. No fever or chills.No melena or hematochezia.No bleeding.No claudication  Physical Exam: BP 186/80  Pulse 53  Ht 5\' 8"  (1.727 m)  Wt 179 lb (81.194 kg)  BMI 27.22 kg/m2 General: Well-nourished white male in no apparent distress Neck: Normal carotid upstroke no carotid bruits. JVD is 5-6 cm. No thyromegaly nonnodular thyroid Lungs: Clear breath sounds bilaterally without wheezing Cardiac: Regular rate and rhythm with normal S1-S2 and no murmur rubs or gallops. Vascular: Normal dorsalis pedis and posterior tibial pulses. Skin: Warm and dry.  12lead ECG: Normal  sinus rhythm nonspecific ST-T wave changes otherwise normal tracing Limited bedside ECHO:N/A

## 2011-04-15 NOTE — Assessment & Plan Note (Signed)
Laboratory work per his primary care physician.

## 2013-08-21 ENCOUNTER — Observation Stay (HOSPITAL_COMMUNITY)
Admission: EM | Admit: 2013-08-21 | Discharge: 2013-08-23 | Disposition: A | Discharge status: Home / Self Care | Payer: Medicare Other | Attending: Internal Medicine | Admitting: Internal Medicine

## 2013-08-21 ENCOUNTER — Encounter (HOSPITAL_COMMUNITY): Payer: Self-pay | Admitting: Emergency Medicine

## 2013-08-21 ENCOUNTER — Emergency Department (HOSPITAL_COMMUNITY): Payer: Medicare Other

## 2013-08-21 ENCOUNTER — Observation Stay (HOSPITAL_COMMUNITY): Payer: Medicare Other

## 2013-08-21 DIAGNOSIS — I2584 Coronary atherosclerosis due to calcified coronary lesion: Secondary | ICD-10-CM | POA: Insufficient documentation

## 2013-08-21 DIAGNOSIS — Z7982 Long term (current) use of aspirin: Secondary | ICD-10-CM | POA: Insufficient documentation

## 2013-08-21 DIAGNOSIS — Z9861 Coronary angioplasty status: Secondary | ICD-10-CM | POA: Insufficient documentation

## 2013-08-21 DIAGNOSIS — R222 Localized swelling, mass and lump, trunk: Secondary | ICD-10-CM

## 2013-08-21 DIAGNOSIS — I2510 Atherosclerotic heart disease of native coronary artery without angina pectoris: Secondary | ICD-10-CM | POA: Insufficient documentation

## 2013-08-21 DIAGNOSIS — I498 Other specified cardiac arrhythmias: Secondary | ICD-10-CM | POA: Insufficient documentation

## 2013-08-21 DIAGNOSIS — E785 Hyperlipidemia, unspecified: Secondary | ICD-10-CM | POA: Insufficient documentation

## 2013-08-21 DIAGNOSIS — R918 Other nonspecific abnormal finding of lung field: Secondary | ICD-10-CM

## 2013-08-21 DIAGNOSIS — R079 Chest pain, unspecified: Principal | ICD-10-CM | POA: Insufficient documentation

## 2013-08-21 DIAGNOSIS — Z87891 Personal history of nicotine dependence: Secondary | ICD-10-CM | POA: Insufficient documentation

## 2013-08-21 DIAGNOSIS — I129 Hypertensive chronic kidney disease with stage 1 through stage 4 chronic kidney disease, or unspecified chronic kidney disease: Secondary | ICD-10-CM | POA: Insufficient documentation

## 2013-08-21 DIAGNOSIS — N182 Chronic kidney disease, stage 2 (mild): Secondary | ICD-10-CM | POA: Insufficient documentation

## 2013-08-21 DIAGNOSIS — I259 Chronic ischemic heart disease, unspecified: Secondary | ICD-10-CM | POA: Diagnosis present

## 2013-08-21 DIAGNOSIS — I10 Essential (primary) hypertension: Secondary | ICD-10-CM | POA: Diagnosis present

## 2013-08-21 HISTORY — DX: Essential (primary) hypertension: I10

## 2013-08-21 LAB — CBC
HCT: 36.9 % — ABNORMAL LOW (ref 39.0–52.0)
HEMOGLOBIN: 12.7 g/dL — AB (ref 13.0–17.0)
MCH: 30.5 pg (ref 26.0–34.0)
MCHC: 34.4 g/dL (ref 30.0–36.0)
MCV: 88.7 fL (ref 78.0–100.0)
PLATELETS: 287 10*3/uL (ref 150–400)
RBC: 4.16 MIL/uL — ABNORMAL LOW (ref 4.22–5.81)
RDW: 13.4 % (ref 11.5–15.5)
WBC: 8.3 10*3/uL (ref 4.0–10.5)

## 2013-08-21 LAB — LIPID PANEL
CHOL/HDL RATIO: 4.8 ratio
CHOLESTEROL: 196 mg/dL (ref 0–200)
HDL: 41 mg/dL (ref >39)
LDL Cholesterol: 89 mg/dL (ref 0–99)
TRIGLYCERIDES: 328 mg/dL — AB (ref <150)
VLDL: 66 mg/dL — ABNORMAL HIGH (ref 0–40)

## 2013-08-21 LAB — BASIC METABOLIC PANEL
BUN: 19 mg/dL (ref 6–23)
CALCIUM: 9.1 mg/dL (ref 8.4–10.5)
CO2: 23 mEq/L (ref 19–32)
Chloride: 105 mEq/L (ref 96–112)
Creatinine, Ser: 1.14 mg/dL (ref 0.50–1.35)
GFR, EST AFRICAN AMERICAN: 67 mL/min — AB (ref >90)
GFR, EST NON AFRICAN AMERICAN: 58 mL/min — AB (ref >90)
GLUCOSE: 114 mg/dL — AB (ref 70–99)
Potassium: 4 mEq/L (ref 3.7–5.3)
SODIUM: 141 meq/L (ref 137–147)

## 2013-08-21 LAB — TROPONIN I: Troponin I: <0.3 ng/mL (ref <0.30)

## 2013-08-21 MED ORDER — ACETAMINOPHEN 325 MG PO TABS: 650.0000 mg | ORAL_TABLET | Freq: Four times a day (QID) | ORAL | Status: DC | PRN

## 2013-08-21 MED ORDER — SODIUM CHLORIDE 0.9 % IJ SOLN
3.0000 mL | Freq: Two times a day (BID) | INTRAMUSCULAR | Status: DC
  Administered 2013-08-21 – 2013-08-23 (×4): 3 mL via INTRAVENOUS

## 2013-08-21 MED ORDER — SODIUM CHLORIDE 0.9 % IJ SOLN: 3.0000 mL | INTRAMUSCULAR | Status: DC | PRN

## 2013-08-21 MED ORDER — HYDROCODONE-ACETAMINOPHEN 5-325 MG PO TABS: 1.0000 | ORAL_TABLET | ORAL | Status: DC | PRN

## 2013-08-21 MED ORDER — SODIUM CHLORIDE 0.9 % IV SOLN: 250.0000 mL | INTRAVENOUS | Status: DC | PRN

## 2013-08-21 MED ORDER — ONDANSETRON HCL 4 MG/2ML IJ SOLN: 4.0000 mg | Freq: Four times a day (QID) | INTRAMUSCULAR | Status: DC | PRN

## 2013-08-21 MED ORDER — MORPHINE SULFATE 2 MG/ML IJ SOLN: 1.0000 mg | INTRAMUSCULAR | Status: DC | PRN

## 2013-08-21 MED ORDER — ACETAMINOPHEN 650 MG RE SUPP
650.0000 mg | Freq: Four times a day (QID) | RECTAL | Status: DC | PRN
Start: 2013-08-21 — End: 2013-08-23

## 2013-08-21 MED ORDER — CARVEDILOL 3.125 MG PO TABS
6.2500 mg | ORAL_TABLET | Freq: Two times a day (BID) | ORAL | Status: DC
  Administered 2013-08-22: 6.25 mg via ORAL
  Filled 2013-08-21: qty 2

## 2013-08-21 MED ORDER — ONDANSETRON HCL 4 MG PO TABS: 4.0000 mg | ORAL_TABLET | Freq: Four times a day (QID) | ORAL | Status: DC | PRN

## 2013-08-21 MED ORDER — DOCUSATE SODIUM 100 MG PO CAPS
100.0000 mg | ORAL_CAPSULE | Freq: Two times a day (BID) | ORAL | Status: DC
  Administered 2013-08-21 – 2013-08-23 (×4): 100 mg via ORAL
  Filled 2013-08-21 (×4): qty 1

## 2013-08-21 MED ORDER — ASPIRIN 325 MG PO TABS
325.0000 mg | ORAL_TABLET | Freq: Every day | ORAL | Status: DC
  Administered 2013-08-22: 325 mg via ORAL
  Filled 2013-08-21: qty 1

## 2013-08-21 MED ORDER — ALUM & MAG HYDROXIDE-SIMETH 200-200-20 MG/5ML PO SUSP: 30.0000 mL | Freq: Four times a day (QID) | ORAL | Status: DC | PRN

## 2013-08-21 MED ORDER — ASPIRIN 81 MG PO CHEW
324.0000 mg | CHEWABLE_TABLET | Freq: Once | ORAL | Status: AC
  Administered 2013-08-21: 324 mg via ORAL
  Filled 2013-08-21: qty 4

## 2013-08-21 MED ORDER — LISINOPRIL 10 MG PO TABS
20.0000 mg | ORAL_TABLET | Freq: Every day | ORAL | Status: DC
  Administered 2013-08-21 – 2013-08-23 (×3): 20 mg via ORAL
  Filled 2013-08-21 (×3): qty 2

## 2013-08-21 MED ORDER — NITROGLYCERIN 0.4 MG SL SUBL: 0.4000 mg | SUBLINGUAL_TABLET | SUBLINGUAL | Status: DC | PRN

## 2013-08-21 MED ORDER — SODIUM CHLORIDE 0.9 % IJ SOLN
3.0000 mL | Freq: Two times a day (BID) | INTRAMUSCULAR | Status: DC
  Administered 2013-08-22 – 2013-08-23 (×2): 3 mL via INTRAVENOUS

## 2013-08-21 MED ORDER — IOHEXOL 300 MG/ML  SOLN
80.0000 mL | Freq: Once | INTRAMUSCULAR | Status: AC | PRN
  Administered 2013-08-21: 80 mL via INTRAVENOUS

## 2013-08-21 MED ORDER — ENOXAPARIN SODIUM 40 MG/0.4ML ~~LOC~~ SOLN
40.0000 mg | SUBCUTANEOUS | Status: DC
  Administered 2013-08-21 – 2013-08-22 (×2): 40 mg via SUBCUTANEOUS
  Filled 2013-08-21 (×2): qty 0.4

## 2013-08-21 NOTE — ED Notes (Signed)
Pt states CP x 3-4 days. Constant pain, unable to descirbe, with pain worsening at times. Pain alternating to left and right chest, sometimes entire chest. Hx of Cfx and RCA stent placement. Pt took a nitro at 0300 with pain relief. No pain at present time.

## 2013-08-21 NOTE — ED Provider Notes (Addendum)
CSN: [id number]     Arrival date & time 08/21/13  1037 History  This chart was scribed for Orpah Greek, MD,  by Stacy Gardner, ED Scribe. The patient was seen in room APA17/APA17 and the patient's care was started at 1:28 PM.    First MD Initiated Contact with Patient 08/21/13 1314     Chief Complaint  Patient presents with  . Chest Pain     (Consider location/radiation/quality/duration/timing/severity/associated sxs/prior Treatment) Patient is a 80 y.o. male presenting with chest pain. The history is provided by the spouse, the patient and medical records. No language interpreter was used.  Chest Pain Pain location:  L chest and R chest Pain radiates to:  Does not radiate Pain radiates to the back: no   Pain severity:  Moderate Onset quality:  Sudden Timing:  Constant Progression:  Resolved Chronicity:  Recurrent Relieved by:  Nitroglycerin Worsened by:  Nothing tried Risk factors: male sex    HPI Comments: Henry Hudson is a 80 y.o. male who presents to the Emergency Department complaining of intermittent chest pain for the past three days. The pain alternates from his right upper chest to the left upper chest (at times his entire chest). He mentions trying nitroglycerin for the pain which typically resolves the pain. This is his first time using nitroglycerin in the six years of having his heart stents placed. His last dose of nitroglycerin was this morning. Pt's wife, pt cut down large heavy trees and loaded them on the splitter tree two days ago. Denies any present pain.  Pt has an appointment with Dr. Nadara Mustard.  Past Medical History  Diagnosis Date  . Chest pain, unspecified     2 Vessel Coronary artery Disease  . Intermediate coronary syndrome   . Unspecified hypertensive kidney disease with chronic kidney disease stage I through stage IV, or unspecified   . Other and unspecified hyperlipidemia   . Hypopotassemia   . Other specified cardiac  dysrhythmias(427.89)   . Chronic kidney disease, unspecified   . Personal history of tobacco use, presenting hazards to health    Past Surgical History  Procedure Laterality Date  . Remote lumbar laminectomy    . Left knee replacement      S.C.   Family History  Problem Relation Age of Onset  . Heart failure Mother 12  . Heart failure Father 62   History  Substance Use Topics  . Smoking status: Former Smoker -- 1.00 packs/day for 50 years    Types: Cigarettes    Quit date: 06/15/1993  . Smokeless tobacco: Never Used     Comment: QUIT 1995  . Alcohol Use: No    Review of Systems  Cardiovascular: Positive for chest pain.  All other systems reviewed and are negative.      Allergies  Penicillins  Home Medications   Current Outpatient Rx  Name  Route  Sig  Dispense  Refill  . aspirin 325 MG tablet   Oral   Take 325 mg by mouth daily.           . carvedilol (COREG) 6.25 MG tablet   Oral   Take 1 tablet (6.25 mg total) by mouth 2 (two) times daily with a meal.   60 tablet   6     D/C COREG 20MG  DUE TO COST   . lisinopril (PRINIVIL,ZESTRIL) 20 MG tablet   Oral   Take 20 mg by mouth daily.           Marland Kitchen  nitroGLYCERIN (NITROSTAT) 0.4 MG SL tablet   Sublingual   Place 0.4 mg under the tongue every 5 (five) minutes as needed.            BP 158/98  Pulse 103  Temp(Src) 97.7 F (36.5 C) (Oral)  Resp 16  Ht 5\' 8"  (1.727 m)  Wt 180 lb (81.647 kg)  BMI 27.38 kg/m2  SpO2 97% Physical Exam  Constitutional: He is oriented to person, place, and time. He appears well-developed and well-nourished. No distress.  HENT:  Head: Normocephalic and atraumatic.  Right Ear: Hearing normal.  Left Ear: Hearing normal.  Nose: Nose normal.  Mouth/Throat: Oropharynx is clear and moist and mucous membranes are normal.  Eyes: Conjunctivae and EOM are normal. Pupils are equal, round, and reactive to light.  Neck: Normal range of motion. Neck supple.  Cardiovascular:  Regular rhythm, S1 normal and S2 normal.  Exam reveals no gallop and no friction rub.   No murmur heard. Pulmonary/Chest: Effort normal and breath sounds normal. No respiratory distress. He exhibits no tenderness.  Abdominal: Soft. Normal appearance and bowel sounds are normal. There is no hepatosplenomegaly. There is no tenderness. There is no rebound, no guarding, no tenderness at McBurney's point and negative Murphy's sign. No hernia.  Musculoskeletal: Normal range of motion.  Neurological: He is alert and oriented to person, place, and time. He has normal strength. No cranial nerve deficit or sensory deficit. Coordination normal. GCS eye subscore is 4. GCS verbal subscore is 5. GCS motor subscore is 6.  Skin: Skin is warm, dry and intact. No rash noted. No cyanosis.  Psychiatric: He has a normal mood and affect. His speech is normal and behavior is normal. Thought content normal.    ED Course  Procedures (including critical care time) DIAGNOSTIC STUDIES: Oxygen Saturation is 94% on room air, adequate by my interpretation.    COORDINATION OF CARE:  1:32 PM Discussed course of care with pt which includes overnight admittance and a stress test performed.  Pt understands and agrees.   Labs Review Labs Reviewed  CBC - Abnormal; Notable for the following:    RBC 4.16 (*)    Hemoglobin 12.7 (*)    HCT 36.9 (*)    All other components within normal limits  BASIC METABOLIC PANEL - Abnormal; Notable for the following:    Glucose, Bld 114 (*)    GFR calc non Af Amer 58 (*)    GFR calc Af Amer 67 (*)    All other components within normal limits  TROPONIN I   Imaging Review Dg Chest 2 View  08/21/2013   CLINICAL DATA:  Bilateral chest pain and cough for 4 days, history hypertension, smoking, coronary artery disease post PTCA  EXAM: CHEST  2 VIEW  COMPARISON:  04/25/2008  FINDINGS: Normal heart size, mediastinal contours and pulmonary vascularity.  Mild atherosclerotic calcification aortic  arch.  COPD changes with right upper lobe nodular density question right upper lobe neoplasm, opacity measuring approximately 22 x 16 mm in size on PA view and question 3.2 cm AP on lateral view.  No acute infiltrate, pleural effusion, or pneumothorax.  Multilevel endplate spur formation thoracic spine.  IMPRESSION: COPD changes with suspected right upper lobe mass/nodule; CT chest recommended for further evaluation.  Findings called to Dr. Roderic Palau on 08/21/2013 at 1134 hr.   Electronically Signed   By: Lavonia Dana M.D.   On: 08/21/2013 11:34     EKG Interpretation   Date/Time:  Monday August 21 2013 10:45:52 EDT Ventricular Rate:  94 PR Interval:  154 QRS Duration: 82 QT Interval:  352 QTC Calculation: 440 R Axis:   54 Text Interpretation:  Normal sinus rhythm Normal ECG When compared with  ECG of 27-Apr-2008 06:58, Vent. rate has increased BY  33 BPM Confirmed by  POLLINA  MD, CHRISTOPHER [phone]) on 08/21/2013 1:39:11 PM      MDM   Final diagnoses:  1. chest pain , CAD 2. Right upper lobe mass.     Patient with a previous history of coronary artery disease, status post stenting of RCA and circumflex artery in 2009, presents with chest pain. Patient has had 2 episodes of chest pain in 3 days. He had an episode earlier today which resolved after nitroglycerin. Patient has a history of poorly controlled hypertension, was hypertensive here in the ER. He was, however, pain-free on arrival. There has not been any further pain here in the ER. Because of his history of coronary artery disease with recurrent pain, I have recommended hospitalization for further management. Patient initially was very resistant to hospitalization, but after discussing the risks and benefits with he and his wife, he has consented to admission. Patient will be placed in observation for telemetry monitoring and serial enzymes.  The finding of possible right upper lobe mass on chest x-ray. Will order CT of chest for further  identification.  I personally performed the services described in this documentation, which was scribed in my presence. The recorded information has been reviewed and is accurate.    Orpah Greek, MD 08/21/13 [place], MD 08/21/13 1536

## 2013-08-21 NOTE — H&P (Signed)
Patient seen, independently examined and chart reviewed. I agree with exam, assessment and plan discussed with Dyanne Carrel, NP.  80 year old man with history of two-vessel coronary artery disease, status post stent placement 2009 with no subsequent problems who present to the emergency department with a three-day history of chest pain, improved with nitroglycerin. Worst episode was this morning. Remains quite active, recently was chopping down trees without pain or shortness of breath. He describes the pain as being on both sides of his chest without radiation.  Last seen by his cardiologist 09/2012 at which time the patient was asymptomatic and medical management was continued.  PMH Two-vessel coronary artery disease, chronic kidney disease, remote history of smoking  Subjective: Currently pain-free. He relates several episodes of the last several days usually at night.   Objective: Afebrile, vital signs are stable. No hypoxia. He appears calm and comfortable, vigorous with overall excellent strength. Cardiovascular regular rate and rhythm. Respiratory clear to auscultation bilaterally. No wheezes, rales or rhonchi. Normal respiratory effort. Abdomen soft nontender nondistended. Eyes appear grossly unremarkable. ENT grossly unremarkable. Skin appears grossly normal without rash or induration. Musculoskeletal appears grossly normal. Neurologic exam nonfocal.  Basic metabolic panel unremarkable. Normal. CBC unremarkable. EKG NSR, no acute changes. Chest x-ray suggested COPD, suspected right upper lobe mass. Chest CT showed a right upper lobe mass suspicious for malignancy. Nonspecific tiny nodules were also seen in both lungs. There is no mediastinal or hilar lymphadenopathy.  His chest pain has some typical and atypical features. He does have a history of coronary disease with stent placement sometime ago, however he remains vigorous and very active without recent symptoms of chest pain or shortness  of breath. There are no signs or symptoms to suggest ACS and we will plan observation overnight with serial troponins. If negative can likely be discharged home with outpatient followup with his cardiologist and consideration of stress testing. I discussed the right upper lobe mass with him and that it was suspicious for tumor or malignancy. We discussed biopsy which will be arranged prior to discharge to be done as an outpatient. He would like it done in [place].  Murray Hodgkins, MD [facility] [phone]

## 2013-08-21 NOTE — H&P (Signed)
[facility] History and Physical  Henry Hudson IDP:[id number] DOB: [birth year] DOA: 08/21/2013  Referring physician: Betsey Holiday PCP: [name], MD   Chief Complaint: chest pain  HPI: Henry Hudson is a very pleasant 80 y.o. male with a past medical history that includes hypertension, hyperlipidemia, coronary artery disease status post PTCA in 2009 presents to the ED with the chief complaint of a three-day history of chest pain. Given history of coronary artery disease with recurrent pain he has been referred for admission to rule out.  Patient indicates that 3 days ago he developed sudden right anterior chest pain that he noticed when he awakened during the night to go to the bathroom. He describes the pain as sharp nonradiating but does travel to the left side. He states he has had 4 or 5 episodes over the last 3 days. Characterizes the pain as "mild" until this morning at 3 AM he rated the pain a 7/10. He took a nitroglycerin and felt relief. He states he's had a nitroglycerin since 2009 and has not needed to take it at all. Also reports he has taken nitroglycerin on 2 other occasions over the last 3 days with good relief. He states that he decided to come to the emergency room and on the way here the pain subsided. He denies any palpitations shortness of breath diaphoresis nausea vomiting lower extremity edema or orthopnea. He denies fever chills. He does report an occasional productive cough with thick white sputum. He was very active and 3 days ago was using a wood splitter to cut down fallen trees on his property. During this activity he did not notice any chest pain. He does say that the pain was concerning to him as it was reminiscent of the pain he had in 2009 in which he ultimately had stents. In the emergency room his blood pressure is borderline elevated, he is afebrile and he is not hypoxic. Basic metabolic panel is unremarkable as is complete blood count. EKG yields normal  sinus rhythm and chest x-ray yields COPD changes with suspected right upper lobe mass/nodule. Initial troponin is negative. In the ED he is given 1 aspirin.  Review of Systems:  10 point review of systems complete and all systems are negative except as indicated in his history of present illness Past Medical History  Diagnosis Date  . Chest pain, unspecified     2 Vessel Coronary artery Disease  . Intermediate coronary syndrome   . Unspecified hypertensive kidney disease with chronic kidney disease stage I through stage IV, or unspecified   . Other and unspecified hyperlipidemia   . Hypopotassemia   . Other specified cardiac dysrhythmias(427.89)   . Chronic kidney disease, unspecified   . Personal history of tobacco use, presenting hazards to health    Past Surgical History  Procedure Laterality Date  . Remote lumbar laminectomy    . Left knee replacement      S.C.   Social History:  reports that he quit smoking about 20 years ago. His smoking use included Cigarettes. He has a 50 pack-year smoking history. He has never used smokeless tobacco. He reports that he does not drink alcohol or use illicit drugs. He quit smoking in 1996 and smoked for approximately 30 years prior to that. He is married and lives with his wife. He is a retired Armed forces logistics/support/administrative officer. He is very active in his retired life. Allergies  Allergen Reactions  . Penicillins     REACTION: Flushing    Family  History  Problem Relation Age of Onset  . Heart failure Mother 39  . Heart failure Father 50     Prior to Admission medications   Medication Sig Start Date End Date Taking? Authorizing Provider  aspirin 325 MG tablet Take 325 mg by mouth daily.     Yes Historical Provider, MD  carvedilol (COREG) 6.25 MG tablet Take 1 tablet (6.25 mg total) by mouth 2 (two) times daily with a meal. 04/15/11  Yes Ezra Sites, MD  lisinopril (PRINIVIL,ZESTRIL) 20 MG tablet Take 20 mg by mouth daily.     Yes Historical Provider, MD    nitroGLYCERIN (NITROSTAT) 0.4 MG SL tablet Place 0.4 mg under the tongue every 5 (five) minutes as needed.     Yes Historical Provider, MD   Physical Exam: Filed Vitals:   08/21/13 1500  BP: 158/98  Pulse: 103  Temp:   Resp: 16    BP 158/98  Pulse 103  Temp(Src) 97.7 F (36.5 C) (Oral)  Resp 16  Ht 5\' 8"  (1.727 m)  Wt 81.647 kg (180 lb)  BMI 27.38 kg/m2  SpO2 97%  General:  Appears calm and comfortable Eyes: PERRL, normal lids, irises & conjunctiva ENT: grossly normal hearing, lips & tongue Neck: no LAD, masses or thyromegaly Cardiovascular: RRR, no m/r/g. No LE edema. Pedal pulses present and palpable Telemetry: SR, no arrhythmias  Respiratory: CTA bilaterally, no w/r/r. Normal respiratory effort. Abdomen: soft, ntnd Skin: no rash or induration seen on limited exam Musculoskeletal: grossly normal tone BUE/BLE Psychiatric: grossly normal mood and affect, speech fluent and appropriate Neurologic: grossly non-focal.          Labs on Admission:  Basic Metabolic Panel:  Recent Labs Lab 08/21/13 1054  NA 141  K 4.0  CL 105  CO2 23  GLUCOSE 114*  BUN 19  CREATININE 1.14  CALCIUM 9.1   Liver Function Tests: No results found for this basename: AST, ALT, ALKPHOS, BILITOT, PROT, ALBUMIN,  in the last 168 hours No results found for this basename: LIPASE, AMYLASE,  in the last 168 hours No results found for this basename: AMMONIA,  in the last 168 hours CBC:  Recent Labs Lab 08/21/13 1054  WBC 8.3  HGB 12.7*  HCT 36.9*  MCV 88.7  PLT 287   Cardiac Enzymes:  Recent Labs Lab 08/21/13 1054  TROPONINI <0.30    BNP (last 3 results) No results found for this basename: PROBNP,  in the last 8760 hours CBG: No results found for this basename: GLUCAP,  in the last 168 hours  Radiological Exams on Admission: Dg Chest 2 View  08/21/2013   CLINICAL DATA:  Bilateral chest pain and cough for 4 days, history hypertension, smoking, coronary artery disease post  PTCA  EXAM: CHEST  2 VIEW  COMPARISON:  04/25/2008  FINDINGS: Normal heart size, mediastinal contours and pulmonary vascularity.  Mild atherosclerotic calcification aortic arch.  COPD changes with right upper lobe nodular density question right upper lobe neoplasm, opacity measuring approximately 22 x 16 mm in size on PA view and question 3.2 cm AP on lateral view.  No acute infiltrate, pleural effusion, or pneumothorax.  Multilevel endplate spur formation thoracic spine.  IMPRESSION: COPD changes with suspected right upper lobe mass/nodule; CT chest recommended for further evaluation.  Findings called to Dr. Roderic Palau on 08/21/2013 at 1134 hr.   Electronically Signed   By: Lavonia Dana M.D.   On: 08/21/2013 11:34    EKG: Independently reviewed. Normal sinus  rhythm  Assessment/Plan Principal Problem:   Chest pain: Atypical. Patient with history of CAD status post stents in 2009. Chart review indicates he hasn't seen his cardiologist since 2012. Will admit for observation to rule out. Will monitor telemetry and cycle his troponins and repeat an EKG in the morning. Provide nitroglycerin and morphine for chest pain as needed. Provide Zofran for any nausea. Continue daily aspirin. Had no further pain today. Suspect he would benefit from a stress test but this could likely be done on outpatient basis. I medications include Coreg and we will continue this.  Active Problems:  Lung mass: Incidental finding in patient with history of smoking. Will obtain CT of chest for further evaluation. Will likely need outpatient followup.    DYSLIPIDEMIA: We'll obtain a lipid panel. Patient reports he was on a statin in the past but this was discontinued to 2 side effects    Essential hypertension, benign: Slightly elevated in the emergency department likely related to the fact he hasn't had his medications today but chart review does indicate that his blood pressure was poorly controlled in the past. Home medications include  Coreg and lisinopril. Will continue these. He also reports he has been on Norvasc in the past but this was discontinued.    CORONARY ARTERY DISEASE, S/P PTCA: 2009. Chart review indicates last cardiology note 2012. No chest pain or shortness of breath until 2 days ago. See #1 \   RENAL DISEASE, CHRONIC, STAGE II: Appears stable at baseline.   Code Status: full Family Communication: wife at bedside Disposition Plan: home hopefully tomorrow  Time spent: 40 minutes  [facility] Pager [phone]

## 2013-08-22 ENCOUNTER — Encounter (HOSPITAL_COMMUNITY): Payer: Self-pay

## 2013-08-22 DIAGNOSIS — I2510 Atherosclerotic heart disease of native coronary artery without angina pectoris: Secondary | ICD-10-CM

## 2013-08-22 DIAGNOSIS — I2584 Coronary atherosclerosis due to calcified coronary lesion: Secondary | ICD-10-CM

## 2013-08-22 LAB — TROPONIN I
Troponin I: <0.3 ng/mL (ref <0.30)
Troponin I: <0.3 ng/mL (ref <0.30)

## 2013-08-22 MED ORDER — REGADENOSON 0.4 MG/5ML IV SOLN
0.4000 mg | Freq: Once | INTRAVENOUS | Status: DC
  Filled 2013-08-22: qty 5

## 2013-08-22 MED ORDER — CARVEDILOL 12.5 MG PO TABS
12.5000 mg | ORAL_TABLET | Freq: Two times a day (BID) | ORAL | Status: DC
  Administered 2013-08-22 – 2013-08-23 (×2): 12.5 mg via ORAL
  Filled 2013-08-22 (×2): qty 1

## 2013-08-22 NOTE — Progress Notes (Signed)
Patient refuses to wear telemetry monitor. Educated importance, states he can't sleep with it on, will retry to put on again. Dr. Sarajane Jews notified.

## 2013-08-22 NOTE — Progress Notes (Signed)
Patient seen, independently examined and chart reviewed. I agree with exam, assessment and plan discussed with Dyanne Carrel, NP.  Subjective: He had a few brief episodes of chest pain overnight. No shortness of breath. No pain in his left arm, neck or jaw. Currently has no complaints.  Objective: Afebrile, vital signs are stable. No hypoxia. This appears calm and comfortable. Speech fluent and clear. Cardiovascular regular rate and rhythm. No murmur, rub or gallop. Respiratory clear to auscultation bilaterally. No wheezes, rales rhonchi. Normal respiratory effort. Psychiatric grossly normal mood and affect. Speech fluent and appropriate.  Troponin is negative. LDL 89, total cholesterol 196, triglycerides 328. EKG normal sinus rhythm. No acute changes.  He is currently pain free but he has had recurrent episodes of chest pain. Given his history of coronary disease, as well as CT findings of LAD and left main calcification cardiology consultation obtained, agree with inpatient stress testing. Outpatient arrangements will be made for biopsy of lung mass.   Murray Hodgkins, MD [facility] [phone]

## 2013-08-22 NOTE — Progress Notes (Signed)
UR completed 

## 2013-08-22 NOTE — Consult Note (Signed)
Consulting cardiologist: Carlyle Dolly MD  Clinical Summary Henry Hudson is a 80 y.o.male history of HTN, HL, CAD with prior PTA in 2009 admitted with chest pain Reports episode of dull aching chest pain in midchest initially on Friday night, 4/10. No other associated symptoms. Symptoms were on and off for a few hours, took NG with some relief. States he had been doing heavy yard work earlier that day. Repeat episode late last night with similar pain. Reports pain similar to prior chest pain around the time of his stents but not as severe. States prior to these 2 episode he had not had any symptoms.   CAD with last cath 04/2008: LM 25%, LAD 25%, LCX distal 99%, RCA 50% with distal 80% lesion. The distal LCX and RCA both received DES. No LV gram, do not see prior echo in our system.   EKG NSR with no ischemic changes, troponins negative. CT PE shows right upper lobe mass, no PE.  Allergies  Allergen Reactions  . Penicillins     REACTION: Flushing    Medications Scheduled Medications: . aspirin  325 mg Oral Daily  . carvedilol  6.25 mg Oral BID WC  . docusate sodium  100 mg Oral BID  . enoxaparin (LOVENOX) injection  40 mg Subcutaneous Q24H  . lisinopril  20 mg Oral Daily  . sodium chloride  3 mL Intravenous Q12H  . sodium chloride  3 mL Intravenous Q12H     Infusions:     PRN Medications:  sodium chloride, acetaminophen, acetaminophen, alum & mag hydroxide-simeth, HYDROcodone-acetaminophen, morphine injection, nitroGLYCERIN, ondansetron (ZOFRAN) IV, ondansetron, sodium chloride   Past Medical History  Diagnosis Date  . Chest pain, unspecified     2 Vessel Coronary artery Disease  . Intermediate coronary syndrome   . Unspecified hypertensive kidney disease with chronic kidney disease stage I through stage IV, or unspecified   . Other and unspecified hyperlipidemia   . Hypopotassemia   . Other specified cardiac dysrhythmias(427.89)   . Chronic kidney disease, unspecified    . Personal history of tobacco use, presenting hazards to health     Past Surgical History  Procedure Laterality Date  . Remote lumbar laminectomy    . Left knee replacement      S.C.    Family History  Problem Relation Age of Onset  . Heart failure Mother 39  . Heart failure Father 79    Social History Henry Hudson reports that he quit smoking about 20 years ago. His smoking use included Cigarettes. He has a 50 pack-year smoking history. He has never used smokeless tobacco. Henry Hudson reports that he does not drink alcohol.  Review of Systems CONSTITUTIONAL: No weight loss, fever, chills, weakness or fatigue.  HEENT: Eyes: No visual loss, blurred vision, double vision or yellow sclerae. No hearing loss, sneezing, congestion, runny nose or sore throat.  SKIN: No rash or itching.  CARDIOVASCULAR: per HPI RESPIRATORY: No shortness of breath, cough or sputum.  GASTROINTESTINAL: No anorexia, nausea, vomiting or diarrhea. No abdominal pain or blood.  GENITOURINARY: no polyuria, no dysuria NEUROLOGICAL: No headache, dizziness, syncope, paralysis, ataxia, numbness or tingling in the extremities. No change in bowel or bladder control.  MUSCULOSKELETAL: No muscle, back pain, joint pain or stiffness.  HEMATOLOGIC: No anemia, bleeding or bruising.  LYMPHATICS: No enlarged nodes. No history of splenectomy.  PSYCHIATRIC: No history of depression or anxiety.      Physical Examination Blood pressure 157/71, pulse 70, temperature 97.7 F (36.5 C),  temperature source Oral, resp. rate 20, height 5\' 8"  (1.727 m), weight 181 lb (82.1 kg), SpO2 98.00%.  Intake/Output Summary (Last 24 hours) at 08/22/13 1259 Last data filed at 08/22/13 0900  Gross per 24 hour  Intake    820 ml  Output      0 ml  Net    820 ml     Cardiovascular: RRR, no m/r/g, no JVD, no carotid bruits  Respiratory: CTAB  GI: abdomen soft, NT, ND  MSK: extremities are warm, no edema  Neuro: no focal  deficits     Lab Results  Basic Metabolic Panel:  Recent Labs Lab 08/21/13 1054  NA 141  K 4.0  CL 105  CO2 23  GLUCOSE 114*  BUN 19  CREATININE 1.14  CALCIUM 9.1    Liver Function Tests: No results found for this basename: AST, ALT, ALKPHOS, BILITOT, PROT, ALBUMIN,  in the last 168 hours  CBC:  Recent Labs Lab 08/21/13 1054  WBC 8.3  HGB 12.7*  HCT 36.9*  MCV 88.7  PLT 287    Cardiac Enzymes:  Recent Labs Lab 08/21/13 1054 08/21/13 1742 08/21/13 2327 08/22/13 0538  TROPONINI <0.30 <0.30 <0.30 <0.30    BNP: No components found with this basename: POCBNP,    Imaging 04/2008 Cath RESULTS: Hemodynamics: RA mean 1; RV 12/2; PA 70, #1 with a mean of 7;  pulmonary capillary pressure mean 4; LV 120/7; and AO 120/60.  Coronaries: Left main had 25% stenosis. The LAD had proximal 25%  followed by tandem mid 25% lesion. The LAD wrapped the apex. There was  small first diagonal, which was free of high-grade disease. A moderate-  sized second diagonal had a long ostial 30% stenosis. The circumflex in  the AV groove had diffuse luminal irregularities. The distal vessel  before a large second posterolateral had a focal 99% stenosis. First  obtuse marginal was large with ostial 25% stenosis. Second obtuse  marginal was moderate-sized with ostial 40% stenosis. First  posterolateral was moderate size and normal. Second posterolateral as  described, was compromised by the 99% lesion in the AV groove medially  proximal to the takeoff of this vessel. The vessel was large without  high-grade stenosis. Right coronary artery is a dominant vessel. There  was a long proximal 50% stenosis, terminating as a long 80% lesion  before the RV Henry Hudson. There was a PDA, which was large and normal.  Left ventriculogram: A left ventricle was not injected secondary to  some mild renal insufficiency.  CONCLUSION: Severe circumflex and right coronary artery stenosis.  Normal right  heart and pulmonary pressures. It certainly seems that his  dyspnea is an anginal equivalent. He does have chest discomfort.  PLAN: Based on the above. The patient who had percutaneous  revascularization of the right coronary artery and circumflex by Dr.  Burt Knack. He will have aggressive secondary risk reduction.    ASSESSMENT: Successful two-vessel percutaneous coronary intervention  using single drug-eluting stents in the distal left circumflex and  proximal to mid right coronary artery.  RECOMMENDATIONS: I recommend a minimum of 12 months' dual antiplatelet  therapy with aspirin and Plavix.   Impression/Recommendations 1. Chest pain - history of CAD, 2 episodes of chest pain he describes similar to his pain prior to prior stents - no evidence of ACS by EKG or cardiac enzymes, currently pain free - given recurrent episodes of pain in patient with known disease recommend inpatient stress test, will order Lexiscan MPI for tomorrow.  Please keep NPO at midnight. - currently pain free without evidence of ACS, in setting of malignancy will hold off on anticoag at this time.   Carlyle Dolly, M.D., F.A.C.C.

## 2013-08-22 NOTE — Progress Notes (Signed)
Continues to refuse to wear telemetry. States he doesn't need it. Patient is alert and oriented. Discussed reasons for wearing it. States he is going for a stress test in am and them going home. Denies chest pain. Has been on monitor 24 hours with no ectopy or abnormal rhythms.Patiet is in bed fully dressed.

## 2013-08-22 NOTE — Progress Notes (Signed)
[facility] PROGRESS NOTE  YASSEN KINNETT OVZ:[id number] DOB: 1927-08-20 DOA: 08/21/2013 PCP: [name], MD  Assessment/Plan: Principal Problem:  Chest pain: Atypical. Patient with history of CAD status post stents in 2009.  No events on telemetry. Troponins negative x3. EKG NSR x 2.  Reports mild pain this am. Evaluated by cardiology who opine that given hx and recurrent episodes of pain inpatient stress test warranted. Will be NPO past midnight.   Active Problems:  Lung mass: Incidental finding in patient with history of smoking. CT of chest with  an abnormal right upper lobe mass suspicious for malignancy. Have spoken with pulmonology who opine biopsy can be obtained via IR. Will call to schedule for OP.   DYSLIPIDEMIA: Lipid panel with triglycerides 328 and VLDL 66 otherwise unremarkable.  Patient reports he was on a statin in the past but this was discontinued due to side effects   Essential hypertension, benign: Only fair control. Chart review does indicate that his blood pressure was poorly controlled in the past. Home medications include Coreg and lisinopril. Will increase coreg. Monitor  CORONARY ARTERY DISEASE, S/P PTCA: 2009. CT with densey calcified LAD. See #1. No chest pain or shortness of breath until 2 days ago.    RENAL DISEASE, CHRONIC, STAGE II: Appears stable at baseline.    Code Status: full Family Communication: wife at bedside Disposition Plan: home hopefully in am   Consultants:  cardiology  Procedures:  none  Antibiotics:  none  HPI/Subjective: Sitting in chair. Reports intermittent "very mild chest pain".  Objective: Filed Vitals:   08/22/13 1345  BP: 152/96  Pulse: 75  Temp: 98 F (36.7 C)  Resp: 18    Intake/Output Summary (Last 24 hours) at 08/22/13 1515 Last data filed at 08/22/13 1300  Gross per 24 hour  Intake    820 ml  Output    100 ml  Net    720 ml   Filed Weights   08/21/13 1052 08/22/13 0429 08/22/13 1345   Weight: 81.647 kg (180 lb) 82.1 kg (181 lb) 85.4 kg (188 lb 4.4 oz)    Exam:   General:  Well nourished calm coopertive  Cardiovascular: RRR No MGR No LE edema  Respiratory: normal effort BS are clear bilaterally no wheeze or rhonchi  Abdomen: soft +BS non-tender to palpation  Musculoskeletal: good muscle tone  Data Reviewed: Basic Metabolic Panel:  Recent Labs Lab 08/21/13 1054  NA 141  K 4.0  CL 105  CO2 23  GLUCOSE 114*  BUN 19  CREATININE 1.14  CALCIUM 9.1   Liver Function Tests: No results found for this basename: AST, ALT, ALKPHOS, BILITOT, PROT, ALBUMIN,  in the last 168 hours No results found for this basename: LIPASE, AMYLASE,  in the last 168 hours No results found for this basename: AMMONIA,  in the last 168 hours CBC:  Recent Labs Lab 08/21/13 1054  WBC 8.3  HGB 12.7*  HCT 36.9*  MCV 88.7  PLT 287   Cardiac Enzymes:  Recent Labs Lab 08/21/13 1054 08/21/13 1742 08/21/13 2327 08/22/13 0538  TROPONINI <0.30 <0.30 <0.30 <0.30   BNP (last 3 results) No results found for this basename: PROBNP,  in the last 8760 hours CBG: No results found for this basename: GLUCAP,  in the last 168 hours  No results found for this or any previous visit (from the past 240 hour(s)).   Studies: Dg Chest 2 View  08/21/2013   CLINICAL DATA:  Bilateral chest pain and cough  for 4 days, history hypertension, smoking, coronary artery disease post PTCA  EXAM: CHEST  2 VIEW  COMPARISON:  04/25/2008  FINDINGS: Normal heart size, mediastinal contours and pulmonary vascularity.  Mild atherosclerotic calcification aortic arch.  COPD changes with right upper lobe nodular density question right upper lobe neoplasm, opacity measuring approximately 22 x 16 mm in size on PA view and question 3.2 cm AP on lateral view.  No acute infiltrate, pleural effusion, or pneumothorax.  Multilevel endplate spur formation thoracic spine.  IMPRESSION: COPD changes with suspected right upper lobe  mass/nodule; CT chest recommended for further evaluation.  Findings called to Dr. Roderic Palau on 08/21/2013 at 1134 hr.   Electronically Signed   By: Lavonia Dana M.D.   On: 08/21/2013 11:34   Ct Chest W Contrast  08/22/2013   ADDENDUM REPORT: 08/22/2013 09:55  ADDENDUM: There is densely calcified plaque in the LAD and likely in the left main coronary artery consistent with coronary atherosclerosis. .   Electronically Signed   By: David  Martinique   On: 08/22/2013 09:55   08/22/2013   CLINICAL DATA:  Four-day history of chest pain, suspected right upper lobe mass on chest x-ray today, history of tobacco use and coronary artery disease  EXAM: CT CHEST WITH CONTRAST  TECHNIQUE: Multidetector CT imaging of the chest was performed during intravenous contrast administration.  CONTRAST:  35mL OMNIPAQUE IOHEXOL 300 MG/ML  SOLN  COMPARISON:  DG CHEST 2 VIEW dated 08/21/2013; DG CHEST 1V PORT dated 04/25/2008  FINDINGS: There is a spiculated soft tissue density mass in the anterior inferior aspect of the right upper lobe. It measures 2.3 cm transversely x 2.1 cm AP x 1.8 cm in superior to inferior dimension. There is an approximately 4 mm diameter subpleural soft tissue nodule along the lateral aspect of the major fissure seen on image 34 of the lung windows. Mild thickening along the major fissure on image 33 is demonstrated. No abnormal nodules in the right lower lobe are demonstrated. In the right middle lobe there is a 4 mm diameter nodule anteriorly on image 40. In the left upper lobe. There is minimal subpleural atelectasis versus fibrosis on image 13 anteriorly. On image 30 there is a 1 mm diameter nodule posteriorly and laterally in the upper lobe. On image 30in the medial aspect of the major fissure there is a 1 x 3 mm diameter nodule. In the left lower lobe no abnormal nodules are demonstrated.  Within the mediastinum and hilar regions no pathologic size lymph nodes are demonstrated. The caliber of the thoracic aorta is  normal. The cardiac chambers are normal in size. There is a tiny hiatal hernia. There is no pleural or pericardial effusion. The retrosternal soft tissues appear normal. The thyroid lobes are normal in density and symmetric in size. There is no axillary lymphadenopathy.  The lumbar vertebral bodies are preserved in height. Exuberant bridging anterior endplate osteophytes are present in the mid and lower thoracic spine. The sternum exhibits no acute abnormality. The observed portions of the ribs exhibit no lytic nor blastic lesions.  Within the upper abdomen the observed portions of the liver and spleen are normal. There is a 1.2 cm diameter accessory spleen. The adrenal glands are not enlarged.  IMPRESSION: 1. There is an abnormal right upper lobe mass suspicious for malignancy. There are other tiny nodules within both lungs which measure less than 5 mm in diameter and which are nonspecific. 2. There is no evidence of mediastinal or hilar lymphadenopathy. There  is no pleural or pericardial effusion. 3. There is no evidence of CHF nor of pneumonia. 4. These results were called by telephone at the time of interpretation on 08/21/2013 at 4:12 PM to Dr. Joseph Berkshire , who verbally acknowledged these results.  Electronically Signed: By: David  Martinique On: 08/21/2013 16:14    Scheduled Meds: . aspirin  325 mg Oral Daily  . carvedilol  6.25 mg Oral BID WC  . docusate sodium  100 mg Oral BID  . enoxaparin (LOVENOX) injection  40 mg Subcutaneous Q24H  . lisinopril  20 mg Oral Daily  . [START ON 08/23/2013] regadenoson  0.4 mg Intravenous Once  . sodium chloride  3 mL Intravenous Q12H  . sodium chloride  3 mL Intravenous Q12H   Continuous Infusions:   Principal Problem:   Chest pain Active Problems:   DYSLIPIDEMIA   Essential hypertension, benign   CORONARY ARTERY DISEASE, S/P PTCA   RENAL DISEASE, CHRONIC, STAGE II   Lung mass    Time spent: 30 minutes    [facility]   7PM-7AM, please contact night-coverage at [email or web address], password [facility] 08/22/2013, 3:15 PM  LOS: 1 day

## 2013-08-23 ENCOUNTER — Observation Stay (HOSPITAL_COMMUNITY): Payer: Medicare Other

## 2013-08-23 ENCOUNTER — Encounter (HOSPITAL_COMMUNITY): Payer: Self-pay

## 2013-08-23 ENCOUNTER — Other Ambulatory Visit (HOSPITAL_COMMUNITY): Payer: Medicare Other | Admitting: Internal Medicine

## 2013-08-23 DIAGNOSIS — I10 Essential (primary) hypertension: Secondary | ICD-10-CM

## 2013-08-23 DIAGNOSIS — R918 Other nonspecific abnormal finding of lung field: Secondary | ICD-10-CM

## 2013-08-23 MED ORDER — TECHNETIUM TC 99M SESTAMIBI - CARDIOLITE
10.0000 | Freq: Once | INTRAVENOUS | Status: AC | PRN
  Administered 2013-08-23: 10 via INTRAVENOUS

## 2013-08-23 MED ORDER — HYDROCODONE-ACETAMINOPHEN 5-325 MG PO TABS: 1.0000 | ORAL_TABLET | ORAL | Status: DC | PRN

## 2013-08-23 MED ORDER — ASPIRIN EC 81 MG PO TBEC
81.0000 mg | DELAYED_RELEASE_TABLET | Freq: Every day | ORAL | Status: DC
  Administered 2013-08-23: 81 mg via ORAL
  Filled 2013-08-23: qty 1

## 2013-08-23 MED ORDER — DSS 100 MG PO CAPS: 100.0000 mg | ORAL_CAPSULE | Freq: Two times a day (BID) | ORAL | Status: DC

## 2013-08-23 MED ORDER — LIVING BETTER WITH HEART FAILURE BOOK
Freq: Once | Status: AC
  Administered 2013-08-23: 1
  Filled 2013-08-23: qty 1

## 2013-08-23 MED ORDER — TECHNETIUM TC 99M SESTAMIBI GENERIC - CARDIOLITE
30.0000 | Freq: Once | INTRAVENOUS | Status: AC | PRN
  Administered 2013-08-23: 30 via INTRAVENOUS

## 2013-08-23 MED ORDER — CARVEDILOL 12.5 MG PO TABS: 12.5000 mg | ORAL_TABLET | Freq: Two times a day (BID) | ORAL | Status: DC

## 2013-08-23 MED ORDER — ASPIRIN 81 MG PO TBEC: 81.0000 mg | DELAYED_RELEASE_TABLET | Freq: Every day | ORAL | Status: DC

## 2013-08-23 MED ORDER — SODIUM CHLORIDE 0.9 % IJ SOLN
INTRAMUSCULAR | Status: AC
  Administered 2013-08-23: 10 mL via INTRAVENOUS
  Filled 2013-08-23: qty 10

## 2013-08-23 MED ORDER — REGADENOSON 0.4 MG/5ML IV SOLN
INTRAVENOUS | Status: AC
  Administered 2013-08-23: 0.4 mg via INTRAVENOUS
  Filled 2013-08-23: qty 5

## 2013-08-23 NOTE — Progress Notes (Signed)
Follow up cardiology progress note. Negative Lexiscan MPI for ischemia. No further cardiac testing or interventions planned at this time. Patient needs to follow up with NP Purcell Nails in 2-3 weeks. Will sign off of inpatient care.    Carlyle Dolly MD

## 2013-08-23 NOTE — Discharge Planning (Signed)
Pt stated he was ready to go home and pain was under control.  Pt's IV DCd.  And also gave pt folder for education on living with HF.  Pt education of what s/sx to look for which he'd need to call doctor or return to the hospital.  Pt was given script, DC papers, education and told of FU appointments with family in room.  Pt was walked to car by myself and family.

## 2013-08-23 NOTE — Progress Notes (Addendum)
Patient ID: Henry Hudson, male   DOB: 1928/02/21, 80 y.o.   MRN: [id number]     Primary cardiologist:  Subjective:    No chest pain overnight  Objective:   Temp:  [97.9 F (36.6 C)-98.8 F (37.1 C)] 97.9 F (36.6 C) (03/11 0414) Pulse Rate:  [65-75] 65 (03/11 0414) Resp:  [18] 18 (03/11 0414) BP: (121-152)/(61-96) 121/61 mmHg (03/11 0414) SpO2:  [95 %-98 %] 98 % (03/11 0414) Weight:  [186 lb 1.1 oz (84.4 kg)-188 lb 4.4 oz (85.4 kg)] 186 lb 1.1 oz (84.4 kg) (03/11 0414) Last BM Date: 08/21/13  Filed Weights   08/22/13 0429 08/22/13 1345 08/23/13 0414  Weight: 181 lb (82.1 kg) 188 lb 4.4 oz (85.4 kg) 186 lb 1.1 oz (84.4 kg)    Intake/Output Summary (Last 24 hours) at 08/23/13 0914 Last data filed at 08/22/13 1930  Gross per 24 hour  Intake    480 ml  Output    100 ml  Net    380 ml    Telemetry: NSR  Exam:  General: NAD  Resp: CTAB  Cardiac: RRR, no m/r/g, no JVD, no carotid bruits  GI: abdomen soft, NT, ND  MSK: lower extremities are warm, no edema  Neuro: no focal deficits   Lab Results:  Basic Metabolic Panel:  Recent Labs Lab 08/21/13 1054  NA 141  K 4.0  CL 105  CO2 23  GLUCOSE 114*  BUN 19  CREATININE 1.14  CALCIUM 9.1    Liver Function Tests: No results found for this basename: AST, ALT, ALKPHOS, BILITOT, PROT, ALBUMIN,  in the last 168 hours  CBC:  Recent Labs Lab 08/21/13 1054  WBC 8.3  HGB 12.7*  HCT 36.9*  MCV 88.7  PLT 287    Cardiac Enzymes:  Recent Labs Lab 08/21/13 1742 08/21/13 2327 08/22/13 0538  TROPONINI <0.30 <0.30 <0.30    BNP: No results found for this basename: PROBNP,  in the last 8760 hours  Coagulation: No results found for this basename: INR,  in the last 168 hours  ECG:   Medications:   Scheduled Medications: . aspirin  325 mg Oral Daily  . carvedilol  12.5 mg Oral BID WC  . docusate sodium  100 mg Oral BID  . enoxaparin (LOVENOX) injection  40 mg Subcutaneous Q24H  . lisinopril   20 mg Oral Daily  . regadenoson  0.4 mg Intravenous Once  . sodium chloride  3 mL Intravenous Q12H  . sodium chloride  3 mL Intravenous Q12H     Infusions:     PRN Medications:  sodium chloride, acetaminophen, acetaminophen, alum & mag hydroxide-simeth, HYDROcodone-acetaminophen, morphine injection, nitroGLYCERIN, ondansetron (ZOFRAN) IV, ondansetron, sodium chloride, technetium sestamibi generic     Assessment/Plan    1. Chest pain  - history of CAD, 2 episodes of chest pain he describes similar to his pain prior to prior stents  - no evidence of ACS by EKG or cardiac enzymes, currently pain free  - given recurrent episodes of pain in patient with known disease recommend inpatient stress test, will follow up on Earl results today.  - currently pain free without evidence of ACS, in setting of malignancy will hold off on anticoag at this time.  - change ASA to 81 mg daily  2. Cholesterol - from notes he was previusly on simvastatin, he states his PCP stopped due to muscle/joint pains   3. Lung mass - workup per primary team   Carlyle Dolly, M.D., F.A.C.C.

## 2013-08-23 NOTE — Progress Notes (Signed)
Stress Lab Nurses Notes - [facility] Na  HAYDAN MANSOURI 08/23/2013 Reason for doing test: CAD and Chest Pain Type of test: Wille Glaser / Inpatient Nurse performing test: Gerrit Halls, RN Nuclear Medicine Tech: [place] Bing. Echo Tech: Not Applicable MD performing test: Branch/M.Lenze PA Family MD: Nadara Mustard Test explained and consent signed: yes IV started: 20g jelco, Saline lock flushed, No redness or edema and Saline lock from floor Symptoms: Stomach discomfort Treatment/Intervention: None Reason test stopped: protocol completed After recovery IV was: No redness or edema and Saline Lock flushed Patient to return to [facility]. Med at : 10:45 Patient discharged: Transported back to room 329 via wc Patient's Condition upon discharge was: stable Comments: During test BP 151/75 & HR 109.  Recovery BP 134/67 & HR 84.  Symptoms resolved in recovery. Geanie Cooley T

## 2013-08-23 NOTE — Discharge Summary (Signed)
Patient seen and examined.  Above note reviewed.  Patient has not had any further chest pain since being in the hospital.  Stress test is negative for acute findings.  He has been referred for outpatient cardiology follow up.  RUL mass was noted on CT chest.  He will be referred to interventional radiology for biopsy.  Follow up with primary care physician.  Henry Hudson

## 2013-08-23 NOTE — Discharge Summary (Signed)
Physician Discharge Summary  Henry Hudson NLZ:[id number] DOB: 19-Aug-1927 DOA: 08/21/2013  PCP: Henry Percy, MD  Admit date: 08/21/2013 Discharge date: 08/23/2013  Time spent: 40 minutes  Recommendations for Outpatient Follow-up:  1. Henry Hudson 09/07/13 for evaluation of symptoms 2. PCP 1-2 weeks for evaluation of symptoms and monitoring of chest pain as coreg increased, follow lipid panel and BP control. 3. IR will contact patient to schedule biopsy of lung mass  Discharge Diagnoses:  Principal Problem:   Chest pain Active Problems:   DYSLIPIDEMIA   Essential hypertension, benign   CORONARY ARTERY DISEASE, S/P PTCA   RENAL DISEASE, CHRONIC, STAGE II   Lung mass   Coronary artery calcification   CAD (coronary artery disease)   Discharge Condition: stable  Diet recommendation: heart healthy  Filed Weights   08/22/13 0429 08/22/13 1345 08/23/13 0414  Weight: 82.1 kg (181 lb) 85.4 kg (188 lb 4.4 oz) 84.4 kg (186 lb 1.1 oz)    History of present illness:  Henry Hudson is a very pleasant 80 y.o. male with a past medical history that includes hypertension, hyperlipidemia, coronary artery disease status post PTCA in 2009 presented to the ED on 08/21/13 with chief complaint of a three-day history of chest pain. Given history of coronary artery disease with recurrent pain he was referred for admission to rule out.   Patient indicated that 3 days prior he developed sudden right anterior chest pain that he noticed when he awakened during the night to go to the bathroom. He described the pain as sharp nonradiating but does travel to the left side. He stated he  had 4 or 5 episodes over the previous 3 days. Characterized the pain as "mild" until morning of presentation at 3 AM he rated the pain a 7/10. He took a nitroglycerin and felt relief. He stated he'd not had a nitroglycerin since 2009 and had not needed to take it at all. Also reported he had taken nitroglycerin on 2 other  occasions over the previous 3 days with good relief. He stated that he decided to come to the emergency room and on the way here the pain subsided. He denied any palpitations shortness of breath diaphoresis nausea vomiting lower extremity edema or orthopnea. He denied fever chills. He reported an occasional productive cough with thick white sputum. He is very active and 3 days prior was using a wood splitter to cut down fallen trees on his property. During this activity he did not notice any chest pain. He said that the pain was concerning to him as it was reminiscent of the pain he had in 2009 in which he ultimately had stents. In the emergency department his blood pressure was borderline elevated, he was afebrile and he was not hypoxic. Basic metabolic panel was unremarkable as is his complete blood count. EKG yielded normal sinus rhythm and chest x-ray yielded COPD changes with suspected right upper lobe mass/nodule. Initial troponin was negative. In the ED he was given 1 aspirin  Hospital Course:  Principal Problem:  Chest pain: Atypical. Patient with history of CAD status post stents in 2009. No events on telemetry. Troponins negative x3. EKG NSR x 2. Reported mild pain during the night. Evaluated by cardiology who opine that given hx and recurrent episodes of pain as well as CT findings of LAD and left main calcification inpatient stress test warranted. Stress test negative. Will follow up with cardiology 09/07/13. He is pain free at discharge and had no further episodes.  Active Problems:  Lung mass: Incidental finding in patient with history of smoking. CT of chest with an abnormal right upper lobe mass suspicious for malignancy. Have spoken with pulmonology who opine biopsy can be obtained via IR. I spoke to IR scheduling department who advised that I put the order in EPIC once patient is discharged to ensure that it will not get "erased". They will then review and call patient to schedule.     DYSLIPIDEMIA: Lipid panel with triglycerides 328 and VLDL 66 otherwise unremarkable. Patient reports he was on a statin in the past but this was discontinued due to side effects   Essential hypertension, benign: Only fair control. Chart review does indicate that his blood pressure was poorly controlled in the past. Home medications include Coreg and lisinopril. Coreg was increased and at discharge improved control.    CORONARY ARTERY DISEASE, S/P PTCA: 2009. CT with densey calcified LAD. See #1. No chest pain or shortness of breath until 2 days ago.   RENAL DISEASE, CHRONIC, STAGE II: Appears stable at baseline.    Procedures:  Lexiscan MPI 08/23/13 Negative  Consultations:  Cardiology  Discharge Exam: Filed Vitals:   08/23/13 1204  BP: 122/82  Pulse:   Temp:   Resp:     General: well nourished NAD Cardiovascular: RRR No MGR No LE edema Respiratory: normal effort BS clear bilaterally no wheeze  Discharge Instructions  Discharge Orders   Future Appointments Provider Department Dept Phone   09/07/2013 1:30 PM Henry Colonel, NP [facility] [place] [phone]   Future Orders Complete By Expires   Diet - low sodium heart healthy  As directed    Discharge instructions  As directed    Comments:     Take medication as directed Interventional radiology will contact you to set up biopsy. Follow up with PCP 1-2 weeks for evaluation of symptoms   Increase activity slowly  As directed        Medication List    STOP taking these medications       aspirin 325 MG tablet  Replaced by:  aspirin 81 MG EC tablet      TAKE these medications       aspirin 81 MG EC tablet  Take 1 tablet (81 mg total) by mouth daily.     carvedilol 12.5 MG tablet  Commonly known as:  COREG  Take 1 tablet (12.5 mg total) by mouth 2 (two) times daily with a meal.     DSS 100 MG Caps  Take 100 mg by mouth 2 (two) times daily.     HYDROcodone-acetaminophen 5-325 MG per tablet   Commonly known as:  NORCO/VICODIN  Take 1-2 tablets by mouth every 4 (four) hours as needed for moderate pain.     lisinopril 20 MG tablet  Commonly known as:  PRINIVIL,ZESTRIL  Take 20 mg by mouth daily.     nitroGLYCERIN 0.4 MG SL tablet  Commonly known as:  NITROSTAT  Place 0.4 mg under the tongue every 5 (five) minutes as needed.       Allergies  Allergen Reactions  . Penicillins     REACTION: [place]       Follow-up Information   Follow up with Henry Sims, NP On 09/07/2013. (appointment at 1:30pm)    Specialty:  Nurse Practitioner   Contact information:   [place] [address] [phone]       Follow up with Henry Percy, MD. Schedule an appointment as soon as  possible for a visit in 2 weeks. (for evaluation of symptoms)    Specialty:  Family Medicine   Contact information:   [address] [place] [address] [phone]        The results of significant diagnostics from this hospitalization (including imaging, microbiology, ancillary and laboratory) are listed below for reference.    Significant Diagnostic Studies: Dg Chest 2 View  08/21/2013   CLINICAL DATA:  Bilateral chest pain and cough for 4 days, history hypertension, smoking, coronary artery disease post PTCA  EXAM: CHEST  2 VIEW  COMPARISON:  04/25/2008  FINDINGS: Normal heart size, mediastinal contours and pulmonary vascularity.  Mild atherosclerotic calcification aortic arch.  COPD changes with right upper lobe nodular density question right upper lobe neoplasm, opacity measuring approximately 22 x 16 mm in size on PA view and question 3.2 cm AP on lateral view.  No acute infiltrate, pleural effusion, or pneumothorax.  Multilevel endplate spur formation thoracic spine.  IMPRESSION: COPD changes with suspected right upper lobe mass/nodule; CT chest recommended for further evaluation.  Findings called to Dr. Roderic Palau on 08/21/2013 at 1134 hr.   Electronically Signed   By: Lavonia Dana  M.D.   On: 08/21/2013 11:34   Ct Chest W Contrast  08/22/2013   ADDENDUM REPORT: 08/22/2013 09:55  ADDENDUM: There is densely calcified plaque in the LAD and likely in the left main coronary artery consistent with coronary atherosclerosis. .   Electronically Signed   By: David  Martinique   On: 08/22/2013 09:55   08/22/2013   CLINICAL DATA:  Four-day history of chest pain, suspected right upper lobe mass on chest x-ray today, history of tobacco use and coronary artery disease  EXAM: CT CHEST WITH CONTRAST  TECHNIQUE: Multidetector CT imaging of the chest was performed during intravenous contrast administration.  CONTRAST:  29mL OMNIPAQUE IOHEXOL 300 MG/ML  SOLN  COMPARISON:  DG CHEST 2 VIEW dated 08/21/2013; DG CHEST 1V PORT dated 04/25/2008  FINDINGS: There is a spiculated soft tissue density mass in the anterior inferior aspect of the right upper lobe. It measures 2.3 cm transversely x 2.1 cm AP x 1.8 cm in superior to inferior dimension. There is an approximately 4 mm diameter subpleural soft tissue nodule along the lateral aspect of the major fissure seen on image 34 of the lung windows. Mild thickening along the major fissure on image 33 is demonstrated. No abnormal nodules in the right lower lobe are demonstrated. In the right middle lobe there is a 4 mm diameter nodule anteriorly on image 40. In the left upper lobe. There is minimal subpleural atelectasis versus fibrosis on image 13 anteriorly. On image 30 there is a 1 mm diameter nodule posteriorly and laterally in the upper lobe. On image 30in the medial aspect of the major fissure there is a 1 x 3 mm diameter nodule. In the left lower lobe no abnormal nodules are demonstrated.  Within the mediastinum and hilar regions no pathologic size lymph nodes are demonstrated. The caliber of the thoracic aorta is normal. The cardiac chambers are normal in size. There is a tiny hiatal hernia. There is no pleural or pericardial effusion. The retrosternal soft tissues  appear normal. The thyroid lobes are normal in density and symmetric in size. There is no axillary lymphadenopathy.  The lumbar vertebral bodies are preserved in height. Exuberant bridging anterior endplate osteophytes are present in the mid and lower thoracic spine. The sternum exhibits no acute abnormality. The observed portions of the ribs  exhibit no lytic nor blastic lesions.  Within the upper abdomen the observed portions of the liver and spleen are normal. There is a 1.2 cm diameter accessory spleen. The adrenal glands are not enlarged.  IMPRESSION: 1. There is an abnormal right upper lobe mass suspicious for malignancy. There are other tiny nodules within both lungs which measure less than 5 mm in diameter and which are nonspecific. 2. There is no evidence of mediastinal or hilar lymphadenopathy. There is no pleural or pericardial effusion. 3. There is no evidence of CHF nor of pneumonia. 4. These results were called by telephone at the time of interpretation on 08/21/2013 at 4:12 PM to Dr. Joseph Berkshire , who verbally acknowledged these results.  Electronically Signed: By: David  Martinique On: 08/21/2013 16:14   Nm Myocar Single W/spect W/wall Motion And Ef  08/23/2013   CLINICAL DATA 80 year old male with previous history of coronary artery disease referred for chest pain  EXAM MYOCARDIAL IMAGING WITH SPECT (REST AND PHARMACOLOGIC-STRESS)  GATED LEFT VENTRICULAR WALL MOTION STUDY  LEFT VENTRICULAR EJECTION FRACTION  TECHNIQUE Standard myocardial SPECT imaging was performed after resting intravenous injection of 10 mCi Tc-28m sestamibi. Subsequently, intravenous infusion of Lexiscan was performed under the supervision of the Cardiology staff. At peak effect of the drug, 30 mCi Tc-10m sestamibi was injected intravenously and standard myocardial SPECT imaging was performed. Quantitative gated imaging was also performed to evaluate left ventricular wall motion, and estimate left ventricular ejection  fraction.  COMPARISON None.  FINDINGS Pharmacological stress  Baseline EKG showed normal sinus rhythm. After injection heart rate increased from 66 beats per min up to 109 beats per min and blood pressure increased from 155/78 up to 156/71. The test was stopped after injection was complete, the patient did not experience any chest pain. Post-injection EKG showed no ischemic changes and no significant arrhythmias  Myocardial perfusion imaging  Raw images showed appropriate radiotracer uptake. There was a moderate-sized inferior wall defect seen in the resting images, this area was less intense in the post-injection images. The inferior wall had normal wall motion, overall findings consistent with sub- diaphragmatic attenuation. There were no other myocardial perfusion defects.  Gated imaging showed end-diastolic volume 72 mL, end systolic volume 23 mL, left ventricular ejection fraction 67%, TID 1.03. There was normal wall motion.  IMPRESSION 1.  Negative Lexiscan MPI for ischemia  2.  Normal LV systolic function with normal wall motion  3.  Low risk study for major cardiac events.  SIGNATURE  Electronically Signed   By: Carlyle Dolly   On: 08/23/2013 11:56    Microbiology: No results found for this or any previous visit (from the past 240 hour(s)).   Labs: Basic Metabolic Panel:  Recent Labs Lab 08/21/13 1054  NA 141  K 4.0  CL 105  CO2 23  GLUCOSE 114*  BUN 19  CREATININE 1.14  CALCIUM 9.1   Liver Function Tests: No results found for this basename: AST, ALT, ALKPHOS, BILITOT, PROT, ALBUMIN,  in the last 168 hours No results found for this basename: LIPASE, AMYLASE,  in the last 168 hours No results found for this basename: AMMONIA,  in the last 168 hours CBC:  Recent Labs Lab 08/21/13 1054  WBC 8.3  HGB 12.7*  HCT 36.9*  MCV 88.7  PLT 287   Cardiac Enzymes:  Recent Labs Lab 08/21/13 1054 08/21/13 1742 08/21/13 2327 08/22/13 0538  TROPONINI <0.30 <0.30 <0.30 <0.30    BNP: BNP (last 3 results) No results  found for this basename: PROBNP,  in the last 8760 hours CBG: No results found for this basename: GLUCAP,  in the last 168 hours     Signed:  Radene Gunning  [facility] 08/23/2013, 12:53 PM

## 2013-08-31 ENCOUNTER — Other Ambulatory Visit (HOSPITAL_COMMUNITY): Payer: Self-pay | Admitting: Family Medicine

## 2013-08-31 DIAGNOSIS — R918 Other nonspecific abnormal finding of lung field: Secondary | ICD-10-CM

## 2013-09-07 ENCOUNTER — Ambulatory Visit (INDEPENDENT_AMBULATORY_CARE_PROVIDER_SITE_OTHER): Payer: Medicare Other | Admitting: Adult Health

## 2013-09-07 ENCOUNTER — Encounter: Payer: Self-pay | Admitting: Adult Health

## 2013-09-07 VITALS — BP 140/60 | HR 68 | Ht 68.0 in | Wt 185.0 lb

## 2013-09-07 DIAGNOSIS — E785 Hyperlipidemia, unspecified: Secondary | ICD-10-CM

## 2013-09-07 DIAGNOSIS — I259 Chronic ischemic heart disease, unspecified: Secondary | ICD-10-CM

## 2013-09-07 DIAGNOSIS — I10 Essential (primary) hypertension: Secondary | ICD-10-CM

## 2013-09-07 MED ORDER — AMLODIPINE BESYLATE 5 MG PO TABS: 5.0000 mg | ORAL_TABLET | Freq: Every day | ORAL | Status: DC

## 2013-09-07 MED ORDER — CARVEDILOL 12.5 MG PO TABS: 12.5000 mg | ORAL_TABLET | Freq: Two times a day (BID) | ORAL | Status: DC

## 2013-09-07 MED ORDER — LISINOPRIL 20 MG PO TABS: 20.0000 mg | ORAL_TABLET | Freq: Every day | ORAL | Status: DC

## 2013-09-07 NOTE — Assessment & Plan Note (Signed)
Blood pressure is better controlled currently his wife brings with her a list of his blood pressure recordings with a significant improvement in blood pressure with reinstitution of amlodipine. We will not make any changes at this time.

## 2013-09-07 NOTE — Progress Notes (Signed)
HPI: Henry Hudson is an 80 year old patient of Dr. Harl Bowie we are following posthospitalization after admission for chest pain, hypertension, with history of CAD status post PTCA in 2009, with history of chronic renal disease stage II, a lung mass, and dyslipidemia.    The patient was found to be atypical, but the patient had cardiac stress test completed during hospitalization in the setting of a CT scan revealing LAD and left main calcification. Stress Myoview was completed and found to be negative for ischemia. The patient was chest pain free at discharge without any further episodes.   Incidental finding on CT scan revealed that the patient had a right upper lobe lung mass suspicious for malignancy. Biopsy will be completed as an outpatient by IR. It is also found to have dyslipidemia with triglycerides of 328, and a VLDL of 66 otherwise unremarkable. He was not tolerant of statins and was placed on 1 on discharge. Secondary uncontrolled hypertension, his carvedilol dose was increased during hospitalization to 12.5 mg twice a day, he was continued on lisinopril 20 mg daily and aspirin 81 mg daily he is here for followup.    He comes today without cardiac complaints. He is due for PET scan at the [facility] tomorrow.    He was recently placed back on amlodipine due to hypertension, post hospitalization.        Allergies  Allergen Reactions  . Penicillins     REACTION: [place]    Current Outpatient Prescriptions  Medication Sig Dispense Refill  . amLODipine (NORVASC) 5 MG tablet Take 5 mg by mouth daily.      Marland Kitchen aspirin EC 81 MG EC tablet Take 1 tablet (81 mg total) by mouth daily.      . carvedilol (COREG) 12.5 MG tablet Take 1 tablet (12.5 mg total) by mouth 2 (two) times daily with a meal.  30 tablet  1  . lisinopril (PRINIVIL,ZESTRIL) 20 MG tablet Take 20 mg by mouth daily.        . nitroGLYCERIN (NITROSTAT) 0.4 MG SL tablet Place 0.4 mg under the tongue every 5 (five)  minutes as needed.         No current facility-administered medications for this visit.    Past Medical History  Diagnosis Date  . Chest pain, unspecified     2 Vessel Coronary artery Disease  . Intermediate coronary syndrome   . Unspecified hypertensive kidney disease with chronic kidney disease stage I through stage IV, or unspecified   . Other and unspecified hyperlipidemia   . Hypopotassemia   . Other specified cardiac dysrhythmias(427.89)   . Chronic kidney disease, unspecified   . Personal history of tobacco use, presenting hazards to health   . Hypertension     Past Surgical History  Procedure Laterality Date  . Remote lumbar laminectomy    . Left knee replacement      S.C.    [facility]:QIONGE of systems complete and found to be negative unless listed above  PHYSICAL EXAM BP 140/60  Pulse 68  Ht 5\' 8"  (1.727 m)  Wt 185 lb (83.915 kg)  BMI 28.14 kg/m2  SpO2 98%  General: Well developed, well nourished, in no acute distress Head: Eyes PERRLA, No xanthomas.   Normal cephalic and atramatic  Lungs: Clear bilaterally to auscultation and percussion. Heart: HRRR S1 S2, without  Pulses are 2+ & equal.            No carotid bruit. No JVD.  No abdominal  bruits. No femoral bruits. Abdomen: Bowel sounds are positive, abdomen soft and non-tender without masses or                  Hernia's noted. Msk:  Back normal, normal gait. Normal strength and tone for age. Extremities: No clubbing, cyanosis or edema.  DP +1 Neuro: Alert and oriented X 3. Psych:  Good affect, responds appropriately    ASSESSMENT AND PLAN

## 2013-09-07 NOTE — Assessment & Plan Note (Signed)
Patient is intolerant to statins, causing gout symptoms. He refuses statins.

## 2013-09-07 NOTE — Assessment & Plan Note (Signed)
Is without cardiac complaint at this time. In fact he is feeling better with reinstitution of amlodipine. The patient has had occasional discomfort on the right side of his chest usually in the substernal area of without any worsening or intensity changes. He will be continued on carvedilol 12.5 mg twice a day, lisinopril. We will see him again in 3 months. He wishes to be seen in the [facility] office as this is closer to his home. He will be seen by Dr. Harl Bowie.

## 2013-09-07 NOTE — Progress Notes (Deleted)
Name: Henry Hudson    DOB: [birth year]  Age: 80 y.o.  MR#: [id number]       PCP:  [name], MD      Insurance: Payor: [employer] / Plan: BLUE MEDICARE / Product Type: *No Product type* /   CC:    Chief Complaint  Patient presents with  . Coronary Artery Disease  . Hypertension    VS Filed Vitals:   09/07/13 1326  BP: 140/60  Pulse: 68  Height: $Remove'5\' 8"'kQOgWos$  (1.727 m)  Weight: 185 lb (83.915 kg)  SpO2: 98%    Weights Current Weight  09/07/13 185 lb (83.915 kg)  08/23/13 186 lb 1.1 oz (84.4 kg)  04/14/11 179 lb (81.194 kg)    Blood Pressure  BP Readings from Last 3 Encounters:  09/07/13 140/60  08/23/13 122/82  04/14/11 186/80     Admit date:  (Not on file) Last encounter with RMR:  Visit date not found   Allergy Penicillins  Current Outpatient Prescriptions  Medication Sig Dispense Refill  . amLODipine (NORVASC) 5 MG tablet Take 5 mg by mouth daily.      Marland Kitchen aspirin EC 81 MG EC tablet Take 1 tablet (81 mg total) by mouth daily.      . carvedilol (COREG) 12.5 MG tablet Take 1 tablet (12.5 mg total) by mouth 2 (two) times daily with a meal.  30 tablet  1  . lisinopril (PRINIVIL,ZESTRIL) 20 MG tablet Take 20 mg by mouth daily.        . nitroGLYCERIN (NITROSTAT) 0.4 MG SL tablet Place 0.4 mg under the tongue every 5 (five) minutes as needed.         No current facility-administered medications for this visit.    Discontinued Meds:    Medications Discontinued During This Encounter  Medication Reason  . docusate sodium 100 MG CAPS Error  . HYDROcodone-acetaminophen (NORCO/VICODIN) 5-325 MG per tablet Error    Patient Active Problem List   Diagnosis Date Noted  . Coronary artery calcification 08/22/2013  . CAD (coronary artery disease) 08/22/2013  . Chest pain 08/21/2013  . Lung mass 08/21/2013  . DYSLIPIDEMIA 02/19/2009  . Essential hypertension, benign 07/25/2008  . CORONARY ARTERY DISEASE, S/P PTCA 07/25/2008  . RENAL DISEASE,  CHRONIC, STAGE II 07/25/2008    LABS    Component Value Date/Time   NA 141 08/21/2013 1054   NA 141 04/27/2008 0700   NA 139 04/26/2008 0410   K 4.0 08/21/2013 1054   K 3.9 04/27/2008 0700   K 4.1 04/26/2008 0410   CL 105 08/21/2013 1054   CL 109 04/27/2008 0700   CL 106 04/26/2008 0410   CO2 23 08/21/2013 1054   CO2 25 04/27/2008 0700   CO2 26 04/26/2008 0410   GLUCOSE 114* 08/21/2013 1054   GLUCOSE 104* 04/27/2008 0700   GLUCOSE 111* 04/26/2008 0410   BUN 19 08/21/2013 1054   BUN 12 04/27/2008 0700   BUN 17 04/26/2008 0410   CREATININE 1.14 08/21/2013 1054   CREATININE 1.09 04/27/2008 0700   CREATININE 1.13 04/26/2008 0410   CALCIUM 9.1 08/21/2013 1054   CALCIUM 8.9 04/27/2008 0700   CALCIUM 8.8 04/26/2008 0410   GFRNONAA 58* 08/21/2013 1054   GFRNONAA >60 04/27/2008 0700   GFRNONAA >60 04/26/2008 0410   GFRAA 67* 08/21/2013 1054   GFRAA  Value: >60        The eGFR has been calculated using the MDRD equation. This calculation has not  been validated in all clinical 04/27/2008 0700   GFRAA  Value: >60        The eGFR has been calculated using the MDRD equation. This calculation has not been validated in all clinical 04/26/2008 0410   CMP     Component Value Date/Time   NA 141 08/21/2013 1054   K 4.0 08/21/2013 1054   CL 105 08/21/2013 1054   CO2 23 08/21/2013 1054   GLUCOSE 114* 08/21/2013 1054   BUN 19 08/21/2013 1054   CREATININE 1.14 08/21/2013 1054   CALCIUM 9.1 08/21/2013 1054   GFRNONAA 58* 08/21/2013 1054   GFRAA 67* 08/21/2013 1054       Component Value Date/Time   WBC 8.3 08/21/2013 1054   WBC 10.6* 04/27/2008 0700   WBC 12.9* 04/26/2008 0410   HGB 12.7* 08/21/2013 1054   HGB 12.8* 04/27/2008 0700   HGB 12.9* 04/26/2008 0410   HCT 36.9* 08/21/2013 1054   HCT 37.1* 04/27/2008 0700   HCT 38.0* 04/26/2008 0410   MCV 88.7 08/21/2013 1054   MCV 94.2 04/27/2008 0700   MCV 96.3 04/26/2008 0410    Lipid Panel     Component Value Date/Time   CHOL 196 08/21/2013 1742   TRIG 328* 08/21/2013 1742    HDL 41 08/21/2013 1742   CHOLHDL 4.8 08/21/2013 1742   VLDL 66* 08/21/2013 1742   LDLCALC 89 08/21/2013 1742    ABG    Component Value Date/Time   PHART 7.414 04/26/2008 1132   PCO2ART 36.9 04/26/2008 1132   PO2ART 96.0 04/26/2008 1132   HCO3 23.6 04/26/2008 1132   TCO2 25 04/26/2008 1132   ACIDBASEDEF 1.0 04/26/2008 1132   O2SAT 98.0 04/26/2008 1132     No results found for this basename: TSH   BNP (last 3 results) No results found for this basename: PROBNP,  in the last 8760 hours Cardiac Panel (last 3 results) No results found for this basename: CKTOTAL, CKMB, TROPONINI, RELINDX,  in the last 72 hours  Iron/TIBC/Ferritin No results found for this basename: iron, tibc, ferritin     EKG Orders placed during the hospital encounter of 08/21/13  . EKG 12-LEAD  . EKG 12-LEAD  . ED EKG  . ED EKG  . EKG 12-LEAD  . EKG 12-LEAD  . EKG     Prior Assessment and Plan Problem List as of 09/07/2013     Cardiovascular and Mediastinum   Essential hypertension, benign   Last Assessment & Plan   04/14/2011 Office Visit Written 04/15/2011 12:34 PM by Ezra Sites, MD     Blood pressure is poorly controlled. We will stop atenolol and changed to carvedilol. I told the patient that I would give him prescription for Coreg CR 20 minutes by mouth daily but if this was too expensive we would give him twice a day dosing with the generic preparation.    CORONARY ARTERY DISEASE, S/P PTCA   Last Assessment & Plan   04/14/2011 Office Visit Written 04/15/2011 12:33 PM by Ezra Sites, MD     No recurrent chest pain. Continue current medical therapy    Coronary artery calcification   CAD (coronary artery disease)     Genitourinary   RENAL DISEASE, CHRONIC, STAGE II   Last Assessment & Plan   04/14/2011 Office Visit Written 04/15/2011 12:34 PM by Ezra Sites, MD     Laboratory work per his primary care physician.      Other   DYSLIPIDEMIA  Last Assessment & Plan   04/14/2011 Office  Visit Written 04/15/2011 12:33 PM by Ezra Sites, MD     Followed by Dr. Nadara Mustard.    Chest pain   Lung mass       Imaging: Dg Chest 2 View  08/21/2013   CLINICAL DATA:  Bilateral chest pain and cough for 4 days, history hypertension, smoking, coronary artery disease post PTCA  EXAM: CHEST  2 VIEW  COMPARISON:  04/25/2008  FINDINGS: Normal heart size, mediastinal contours and pulmonary vascularity.  Mild atherosclerotic calcification aortic arch.  COPD changes with right upper lobe nodular density question right upper lobe neoplasm, opacity measuring approximately 22 x 16 mm in size on PA view and question 3.2 cm AP on lateral view.  No acute infiltrate, pleural effusion, or pneumothorax.  Multilevel endplate spur formation thoracic spine.  IMPRESSION: COPD changes with suspected right upper lobe mass/nodule; CT chest recommended for further evaluation.  Findings called to Dr. Roderic Palau on 08/21/2013 at 1134 hr.   Electronically Signed   By: Lavonia Dana M.D.   On: 08/21/2013 11:34   Ct Chest W Contrast  08/22/2013   ADDENDUM REPORT: 08/22/2013 09:55  ADDENDUM: There is densely calcified plaque in the LAD and likely in the left main coronary artery consistent with coronary atherosclerosis. .   Electronically Signed   By: David  Martinique   On: 08/22/2013 09:55   08/22/2013   CLINICAL DATA:  Four-day history of chest pain, suspected right upper lobe mass on chest x-ray today, history of tobacco use and coronary artery disease  EXAM: CT CHEST WITH CONTRAST  TECHNIQUE: Multidetector CT imaging of the chest was performed during intravenous contrast administration.  CONTRAST:  5mL OMNIPAQUE IOHEXOL 300 MG/ML  SOLN  COMPARISON:  DG CHEST 2 VIEW dated 08/21/2013; DG CHEST 1V PORT dated 04/25/2008  FINDINGS: There is a spiculated soft tissue density mass in the anterior inferior aspect of the right upper lobe. It measures 2.3 cm transversely x 2.1 cm AP x 1.8 cm in superior to inferior dimension. There is an  approximately 4 mm diameter subpleural soft tissue nodule along the lateral aspect of the major fissure seen on image 34 of the lung windows. Mild thickening along the major fissure on image 33 is demonstrated. No abnormal nodules in the right lower lobe are demonstrated. In the right middle lobe there is a 4 mm diameter nodule anteriorly on image 40. In the left upper lobe. There is minimal subpleural atelectasis versus fibrosis on image 13 anteriorly. On image 30 there is a 1 mm diameter nodule posteriorly and laterally in the upper lobe. On image 30in the medial aspect of the major fissure there is a 1 x 3 mm diameter nodule. In the left lower lobe no abnormal nodules are demonstrated.  Within the mediastinum and hilar regions no pathologic size lymph nodes are demonstrated. The caliber of the thoracic aorta is normal. The cardiac chambers are normal in size. There is a tiny hiatal hernia. There is no pleural or pericardial effusion. The retrosternal soft tissues appear normal. The thyroid lobes are normal in density and symmetric in size. There is no axillary lymphadenopathy.  The lumbar vertebral bodies are preserved in height. Exuberant bridging anterior endplate osteophytes are present in the mid and lower thoracic spine. The sternum exhibits no acute abnormality. The observed portions of the ribs exhibit no lytic nor blastic lesions.  Within the upper abdomen the observed portions of the liver and spleen are normal.  There is a 1.2 cm diameter accessory spleen. The adrenal glands are not enlarged.  IMPRESSION: 1. There is an abnormal right upper lobe mass suspicious for malignancy. There are other tiny nodules within both lungs which measure less than 5 mm in diameter and which are nonspecific. 2. There is no evidence of mediastinal or hilar lymphadenopathy. There is no pleural or pericardial effusion. 3. There is no evidence of CHF nor of pneumonia. 4. These results were called by telephone at the time of  interpretation on 08/21/2013 at 4:12 PM to Dr. Joseph Berkshire , who verbally acknowledged these results.  Electronically Signed: By: David  Martinique On: 08/21/2013 16:14   Nm Myocar Single W/spect W/wall Motion And Ef  08/23/2013   CLINICAL DATA 80 year old male with previous history of coronary artery disease referred for chest pain  EXAM MYOCARDIAL IMAGING WITH SPECT (REST AND PHARMACOLOGIC-STRESS)  GATED LEFT VENTRICULAR WALL MOTION STUDY  LEFT VENTRICULAR EJECTION FRACTION  TECHNIQUE Standard myocardial SPECT imaging was performed after resting intravenous injection of 10 mCi Tc-38m sestamibi. Subsequently, intravenous infusion of Lexiscan was performed under the supervision of the Cardiology staff. At peak effect of the drug, 30 mCi Tc-32m sestamibi was injected intravenously and standard myocardial SPECT imaging was performed. Quantitative gated imaging was also performed to evaluate left ventricular wall motion, and estimate left ventricular ejection fraction.  COMPARISON None.  FINDINGS Pharmacological stress  Baseline EKG showed normal sinus rhythm. After injection heart rate increased from 66 beats per min up to 109 beats per min and blood pressure increased from 155/78 up to 156/71. The test was stopped after injection was complete, the patient did not experience any chest pain. Post-injection EKG showed no ischemic changes and no significant arrhythmias  Myocardial perfusion imaging  Raw images showed appropriate radiotracer uptake. There was a moderate-sized inferior wall defect seen in the resting images, this area was less intense in the post-injection images. The inferior wall had normal wall motion, overall findings consistent with sub- diaphragmatic attenuation. There were no other myocardial perfusion defects.  Gated imaging showed end-diastolic volume 72 mL, end systolic volume 23 mL, left ventricular ejection fraction 67%, TID 1.03. There was normal wall motion.  IMPRESSION 1.  Negative  Lexiscan MPI for ischemia  2.  Normal LV systolic function with normal wall motion  3.  Low risk study for major cardiac events.  SIGNATURE  Electronically Signed   By: Carlyle Dolly   On: 08/23/2013 11:56

## 2013-09-07 NOTE — Patient Instructions (Addendum)
Your physician recommends that you schedule a follow-up appointment in: 3 months with Dr.Branch at the [facility] office    Your physician recommends that you continue on your current medications as directed. Please refer to the Current Medication list given to you today.     Thank you for choosing [facility] !

## 2013-09-08 ENCOUNTER — Ambulatory Visit (HOSPITAL_COMMUNITY)
Admission: RE | Admit: 2013-09-08 | Discharge: 2013-09-08 | Disposition: A | Discharge status: Home / Self Care | Payer: Medicare Other | Source: Ambulatory Visit | Attending: Family Medicine | Admitting: Family Medicine

## 2013-09-08 ENCOUNTER — Encounter (HOSPITAL_COMMUNITY): Payer: Self-pay

## 2013-09-08 ENCOUNTER — Other Ambulatory Visit (HOSPITAL_COMMUNITY): Payer: Self-pay | Admitting: Family Medicine

## 2013-09-08 DIAGNOSIS — R222 Localized swelling, mass and lump, trunk: Secondary | ICD-10-CM | POA: Insufficient documentation

## 2013-09-08 DIAGNOSIS — I2584 Coronary atherosclerosis due to calcified coronary lesion: Secondary | ICD-10-CM | POA: Insufficient documentation

## 2013-09-08 DIAGNOSIS — R918 Other nonspecific abnormal finding of lung field: Secondary | ICD-10-CM

## 2013-09-08 DIAGNOSIS — I2510 Atherosclerotic heart disease of native coronary artery without angina pectoris: Secondary | ICD-10-CM | POA: Insufficient documentation

## 2013-09-08 LAB — GLUCOSE, CAPILLARY: Glucose-Capillary: 95 mg/dL (ref 70–99)

## 2013-09-08 MED ORDER — FLUDEOXYGLUCOSE F - 18 (FDG) INJECTION
10.1000 | Freq: Once | INTRAVENOUS | Status: AC | PRN
  Administered 2013-09-08: 10.1 via INTRAVENOUS

## 2013-10-06 DIAGNOSIS — C3490 Malignant neoplasm of unspecified part of unspecified bronchus or lung: Secondary | ICD-10-CM | POA: Insufficient documentation

## 2013-10-09 ENCOUNTER — Encounter: Payer: Self-pay | Admitting: Thoracic Surgery (Cardiothoracic Vascular Surgery)

## 2013-10-09 ENCOUNTER — Other Ambulatory Visit: Payer: Self-pay | Admitting: *Deleted

## 2013-10-09 ENCOUNTER — Institutional Professional Consult (permissible substitution) (INDEPENDENT_AMBULATORY_CARE_PROVIDER_SITE_OTHER): Payer: Medicare Other | Admitting: Thoracic Surgery (Cardiothoracic Vascular Surgery)

## 2013-10-09 VITALS — BP 139/71 | HR 92 | Resp 20 | Ht 68.0 in | Wt 185.0 lb

## 2013-10-09 DIAGNOSIS — C3490 Malignant neoplasm of unspecified part of unspecified bronchus or lung: Secondary | ICD-10-CM

## 2013-10-09 DIAGNOSIS — R918 Other nonspecific abnormal finding of lung field: Secondary | ICD-10-CM

## 2013-10-09 DIAGNOSIS — C801 Malignant (primary) neoplasm, unspecified: Secondary | ICD-10-CM

## 2013-10-09 NOTE — Progress Notes (Signed)
PCP is [name], MD Referring Provider is [name], MD  Chief Complaint  Patient presents with  . Lung Cancer    Surgical eval, MRI Brain 10/06/13, PET Scan 09/08/13, CT BX 09/21/13    HPI: 80 year old gentleman presents with chief complaint of a lung mass.  Henry Hudson is an 80 year old gentleman who had atypical chest pain in March. He was evaluated for that with a cardiac workup including EKG and a nuclear stress test. It was determined to be noncardiac in origin. He also had a chest x-ray which showed a right upper lobe mass. This led to a CT scan which confirmed a 2.3 x 2.1 x 1.8 cm mass in the right upper lobe abutting the minor fissure. There were some additional small nodules along the major fissure all less than a centimeter. He then had a PET/CT which showed the mass was hypermetabolic with an SUV of 5.4. There was no evidence of regional or distant metastases. The other, subcentimeter pulmonary nodules were not hypermetabolic.  A needle biopsy was done on April 9 which showed adenocarcinoma. He was referred to Dr. Marco Collie, and now is referred for consideration for surgery.  He says that he has been feeling well other than this right-sided chest pain. He says that it is in the midportion of his chest next to his breastbone. It comes on sporadically and is not associated with exertion. It is not in any way similar to the anginal pain he had in 2009 prior to having stents placed in his circumflex and RCA. That pain was a severe squeezing or tightness in the center of his chest. He says it felt like something had a hold on him and would not let go. He has not had any pain similar to that since his stents were placed. He denies cough, hemoptysis, wheezing, and shortness of breath except with extreme exertion. He has not had any new bone or joint pain. He denies any unusual headaches or visual changes.   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 He can  walk up 2 flights of stairs without shortness of breath unless he is in a hurry.  He smoked one pack a day from age 37. He quit about 20 years ago (40 pack years)  No weight loss over past 6 months  Zubrod Score: At the time of surgery this patient's most appropriate activity status/level should be described as: [x]     0    Normal activity, no symptoms []     1    Restricted in physical strenuous activity but ambulatory, able to do out light work []     2    Ambulatory and capable of self care, unable to do work activities, up and about >50 % of waking hours                              []     3    Only limited self care, in bed greater than 50% of waking hours []     4    Completely disabled, no self care, confined to bed or chair []     5    Moribund   Past Medical History  Diagnosis Date  . Chest pain, unspecified     2 Vessel Coronary artery Disease  . Intermediate coronary syndrome   . Unspecified hypertensive kidney disease with chronic kidney disease stage I through stage  IV, or unspecified   . Other and unspecified hyperlipidemia   . Hypopotassemia   . Other specified cardiac dysrhythmias(427.89)   . Chronic kidney disease, unspecified   . Personal history of tobacco use, presenting hazards to health   . Hypertension   . Coronary artery disease 2009    Past Surgical History  Procedure Laterality Date  . Remote lumbar laminectomy    . Left knee replacement      S.C.  . Stent placement 04/2008 x 2      Family History  Problem Relation Age of Onset  . Heart failure Mother 41  . COPD Mother   . Heart failure Father 85  . Cancer Brother     Social History History  Substance Use Topics  . Smoking status: Former Smoker -- 1.00 packs/day for 50 years    Types: Cigarettes    Quit date: 06/15/1993  . Smokeless tobacco: Never Used     Comment: QUIT 1995  . Alcohol Use: No    Current Outpatient Prescriptions  Medication Sig Dispense Refill  . amLODipine (NORVASC) 5  MG tablet Take 1 tablet (5 mg total) by mouth daily.  90 tablet  3  . aspirin EC 81 MG EC tablet Take 1 tablet (81 mg total) by mouth daily.      . carvedilol (COREG) 12.5 MG tablet Take 1 tablet (12.5 mg total) by mouth 2 (two) times daily with a meal.  180 tablet  3  . lisinopril (PRINIVIL,ZESTRIL) 20 MG tablet Take 1 tablet (20 mg total) by mouth daily.  90 tablet  3  . nitroGLYCERIN (NITROSTAT) 0.4 MG SL tablet Place 0.4 mg under the tongue every 5 (five) minutes as needed.        . tamsulosin (FLOMAX) 0.4 MG CAPS capsule Take 0.4 mg by mouth.        No current facility-administered medications for this visit.    Allergies  Allergen Reactions  . Penicillins     REACTION: Flushing  . Statins Other (See Comments)    Gout    Review of Systems  Constitutional: Negative for fever, chills, activity change and unexpected weight change.  Respiratory: Positive for shortness of breath (with heavy exertion). Negative for cough, chest tightness and wheezing.   Cardiovascular: Positive for chest pain (right anterior pain, "not like when I had my stents").  Genitourinary: Positive for urgency.  Musculoskeletal: Positive for arthralgias and back pain.  All other systems reviewed and are negative.   BP 139/71  Pulse 92  Resp 20  Ht 5\' 8"  (1.727 m)  Wt 185 lb (83.915 kg)  BMI 28.14 kg/m2  SpO2 97% Physical Exam  Vitals reviewed. Constitutional: He is oriented to person, place, and time. He appears well-developed and well-nourished. No distress.  HENT:  Head: Normocephalic and atraumatic.  Eyes: EOM are normal. Pupils are equal, round, and reactive to light.  Neck: Neck supple. No thyromegaly present.  Cardiovascular: Normal rate, regular rhythm and normal heart sounds.  Exam reveals no gallop and no friction rub.   No murmur heard. Pulmonary/Chest: Effort normal and breath sounds normal. He has no wheezes. He has no rales.  Abdominal: Soft. There is no tenderness.  Musculoskeletal: He  exhibits no edema.  Lymphadenopathy:    He has no cervical adenopathy.  Neurological: He is alert and oriented to person, place, and time. No cranial nerve deficit.  Skin: Skin is warm and dry.     Diagnostic Tests:  Myocardial SPECT 08/23/2013  IMPRESSION  1. Negative Lexiscan MPI for ischemia  2. Normal LV systolic function with normal wall motion  3. Low risk study for major cardiac events.  CT CHEST 08/21/2013  IMPRESSION:  1. There is an abnormal right upper lobe mass suspicious for  malignancy. There are other tiny nodules within both lungs which  measure less than 5 mm in diameter and which are nonspecific.  2. There is no evidence of mediastinal or hilar lymphadenopathy.  There is no pleural or pericardial effusion.  3. There is no evidence of CHF nor of pneumonia.  4. These results were called by telephone at the time of  interpretation on 08/21/2013 at 4:12 PM to Dr. Joseph Berkshire ,  who verbally acknowledged these results.  Electronically Signed:  By: David Martinique  On: 08/21/2013 16:14  ADDENDUM:  There is densely calcified plaque in the LAD and likely in the left  main coronary artery consistent with coronary atherosclerosis. .  Electronically Signed  By: David Martinique  On: 08/22/2013 09:55  PET/CT 09/08/2013  IMPRESSION:  1. Hypermetabolic right upper lobe mass consists with primary  bronchogenic carcinoma.  2. Small subpleural nodules along the right oblique fissure are  likely benign.  3. No mediastinal nodal metastasis or distant metastasis.  4. Staging by FDG PET imaging T1b N0 M0  Electronically Signed  By: Suzy Bouchard M.D.  On: 09/08/2013 17:04   Impression: 80 year old man with newly diagnosed adenocarcinoma of the right upper lobe. This is about 2-1/2 cm diameter mass. Does abut the minor fissure. There is no hilar or mediastinal adenopathy. PET/CT an MR of the brain showed no evidence of metastatic disease. This appears to be a stage I  lesion. Surgical resection is the treatment of choice.  He has not yet had pulmonary function testing, but judging by his activity level and fitness I do not think that will be an issue. We will schedule him for PFTs with a room air blood gas.  He does have a cardiac history including a stents placed in his circumflex and right coronary in 2009. He's been having some chest pain. However, he is very clear with his description of his previous angina and the current pain is not in any way similar to the anginal pain. He had a negative stress test a month ago. Given his lack of symptoms and negative stress test and I do not think a formal cardiology consult is necessary prior to surgery.  I had a long discussion with Henry Hudson. We reviewed the CT and PET. We discussed the biopsy results. We discussed treatment options including surgical resection and radiation and/or chemotherapy. They understand that surgical resection of the treatment of choice if feasible. We discussed the nature of the proposed surgical procedure, including the need for general anesthesia, incisions be used, expected hospital stay, and the overall recovery. We discussed the indications, risks, benefits, and alternatives. They understand the risk include, but are not limited to death, MI, DVT, PE, bleeding, possible need for transfusion, infection, prolonged air leak, cardiac arrhythmias, as well as the possibility of unforeseeable complications. They do understand that surgery does not guarantee a cure. They also understand that additional adjuvant therapy may be necessary depending on pathologic findings.  He accepts the risks and wishes to proceed  Plan: Right VATS, right upper lobectomy, possible middle lobectomy on Wednesday, May 6

## 2013-10-11 ENCOUNTER — Encounter (HOSPITAL_COMMUNITY): Payer: Self-pay | Admitting: Pharmacy Technician

## 2013-10-16 ENCOUNTER — Ambulatory Visit (HOSPITAL_COMMUNITY)
Admission: RE | Admit: 2013-10-16 | Discharge: 2013-10-16 | Disposition: A | Discharge status: Home / Self Care | Payer: Medicare Other | Source: Ambulatory Visit | Attending: Thoracic Surgery (Cardiothoracic Vascular Surgery) | Admitting: Thoracic Surgery (Cardiothoracic Vascular Surgery)

## 2013-10-16 ENCOUNTER — Other Ambulatory Visit (HOSPITAL_COMMUNITY): Payer: Medicare Other | Admitting: *Deleted

## 2013-10-16 ENCOUNTER — Encounter (HOSPITAL_COMMUNITY): Payer: Self-pay

## 2013-10-16 ENCOUNTER — Encounter (HOSPITAL_COMMUNITY)
Admission: RE | Admit: 2013-10-16 | Discharge: 2013-10-16 | Disposition: A | Discharge status: Home / Self Care | Payer: Medicare Other | Source: Ambulatory Visit | Attending: Thoracic Surgery (Cardiothoracic Vascular Surgery) | Admitting: Thoracic Surgery (Cardiothoracic Vascular Surgery)

## 2013-10-16 VITALS — BP 130/73 | HR 68 | Temp 98.6°F | Resp 20 | Ht 68.0 in | Wt 178.9 lb

## 2013-10-16 DIAGNOSIS — C3410 Malignant neoplasm of upper lobe, unspecified bronchus or lung: Secondary | ICD-10-CM | POA: Insufficient documentation

## 2013-10-16 DIAGNOSIS — R918 Other nonspecific abnormal finding of lung field: Secondary | ICD-10-CM

## 2013-10-16 DIAGNOSIS — C801 Malignant (primary) neoplasm, unspecified: Secondary | ICD-10-CM

## 2013-10-16 HISTORY — DX: Malignant (primary) neoplasm, unspecified: C80.1

## 2013-10-16 HISTORY — DX: Benign prostatic hyperplasia without lower urinary tract symptoms: N40.0

## 2013-10-16 HISTORY — DX: Unspecified osteoarthritis, unspecified site: M19.90

## 2013-10-16 LAB — URINALYSIS, ROUTINE W REFLEX MICROSCOPIC
Bilirubin Urine: NEGATIVE
Glucose, UA: NEGATIVE mg/dL
Hgb urine dipstick: NEGATIVE
Ketones, ur: NEGATIVE mg/dL
Leukocytes, UA: NEGATIVE
NITRITE: NEGATIVE
PH: 6.5 (ref 5.0–8.0)
Protein, ur: NEGATIVE mg/dL
SPECIFIC GRAVITY, URINE: 1.013 (ref 1.005–1.030)
UROBILINOGEN UA: 1 mg/dL (ref 0.0–1.0)

## 2013-10-16 LAB — ABO/RH: ABO/RH(D): A POS

## 2013-10-16 LAB — PROTIME-INR
INR: 0.99 (ref 0.00–1.49)
Prothrombin Time: 12.9 seconds (ref 11.6–15.2)

## 2013-10-16 LAB — CBC
HCT: 37.2 % — ABNORMAL LOW (ref 39.0–52.0)
HEMOGLOBIN: 12.8 g/dL — AB (ref 13.0–17.0)
MCH: 30.8 pg (ref 26.0–34.0)
MCHC: 34.4 g/dL (ref 30.0–36.0)
MCV: 89.6 fL (ref 78.0–100.0)
Platelets: 270 10*3/uL (ref 150–400)
RBC: 4.15 MIL/uL — AB (ref 4.22–5.81)
RDW: 13.5 % (ref 11.5–15.5)
WBC: 10.8 10*3/uL — AB (ref 4.0–10.5)

## 2013-10-16 LAB — BLOOD GAS, ARTERIAL
Acid-base deficit: 0.5 mmol/L (ref 0.0–2.0)
Bicarbonate: 22.8 mEq/L (ref 20.0–24.0)
Drawn by: 12206
FIO2: 0.21 %
O2 Saturation: 96.8 %
PCO2 ART: 32.7 mmHg — AB (ref 35.0–45.0)
Patient temperature: 98.6
TCO2: 23.8 mmol/L (ref 0–100)
pH, Arterial: 7.458 — ABNORMAL HIGH (ref 7.350–7.450)
pO2, Arterial: 80 mmHg (ref 80.0–100.0)

## 2013-10-16 LAB — COMPREHENSIVE METABOLIC PANEL
ALT: 14 U/L (ref 0–53)
AST: 16 U/L (ref 0–37)
Albumin: 3.7 g/dL (ref 3.5–5.2)
Alkaline Phosphatase: 98 U/L (ref 39–117)
BILIRUBIN TOTAL: 0.5 mg/dL (ref 0.3–1.2)
BUN: 17 mg/dL (ref 6–23)
CALCIUM: 9.2 mg/dL (ref 8.4–10.5)
CHLORIDE: 104 meq/L (ref 96–112)
CO2: 26 meq/L (ref 19–32)
Creatinine, Ser: 1.11 mg/dL (ref 0.50–1.35)
GFR calc Af Amer: 69 mL/min — ABNORMAL LOW (ref >90)
GFR, EST NON AFRICAN AMERICAN: 59 mL/min — AB (ref >90)
Glucose, Bld: 98 mg/dL (ref 70–99)
Potassium: 4.6 mEq/L (ref 3.7–5.3)
Sodium: 142 mEq/L (ref 137–147)
Total Protein: 7.3 g/dL (ref 6.0–8.3)

## 2013-10-16 LAB — APTT: aPTT: 28 seconds (ref 24–37)

## 2013-10-16 LAB — SURGICAL PCR SCREEN
MRSA, PCR: NEGATIVE
Staphylococcus aureus: NEGATIVE

## 2013-10-16 NOTE — Progress Notes (Signed)
Pt's cardiologist is Dr. Harl Bowie. Pt had cardiac workup in March while in the hospital.

## 2013-10-16 NOTE — Pre-Procedure Instructions (Signed)
[name] VZDGLO  10/16/2013   Your procedure is scheduled on:  Wednesday, Oct 18, 2013 at 11:00 AM.   Report to [facility] Entrance "A" Admitting Office at 7:30 AM.   Call this number if you have problems the morning of surgery: [phone]   Remember:   Do not eat food or drink liquids after midnight Tuesday, 10/17/13.   Take these medicines the morning of surgery with A SIP OF WATER: amLODipine (NORVASC), carvedilol (COREG), tamsulosin (FLOMAX), nitroGLYCERIN (NITROSTAT) - if needed.     Do not wear jewelry.  Do not wear lotions, powders, or cologne. You may NOT wear deodorant.             Men may shave face and neck.  Do not bring valuables to the hospital.  [facility] is not responsible                  for any belongings or valuables.               Contacts, dentures or bridgework may not be worn into surgery.  Leave suitcase in the car. After surgery it may be brought to your room.  For patients admitted to the hospital, discharge time is determined by your                treatment team.                            Special Instructions: [place] - Preparing for Surgery  Before surgery, you can play an important role.  Because skin is not sterile, your skin needs to be as free of germs as possible.  You can reduce the number of germs on you skin by washing with CHG (chlorahexidine gluconate) soap before surgery.  CHG is an antiseptic cleaner which kills germs and bonds with the skin to continue killing germs even after washing.  Please DO NOT use if you have an allergy to CHG or antibacterial soaps.  If your skin becomes reddened/irritated stop using the CHG and inform your nurse when you arrive at Short Stay.  Do not shave (including legs and underarms) for at least 48 hours prior to the first CHG shower.  You may shave your face.  Please follow these instructions carefully:   1.  Shower with CHG Soap the night before surgery and the                                 morning of Surgery.  2.  If you choose to wash your hair, wash your hair first as usual with your       normal shampoo.  3.  After you shampoo, rinse your hair and body thoroughly to remove the                      Shampoo.  4.  Use CHG as you would any other liquid soap.  You can apply chg directly       to the skin and wash gently with scrungie or a clean washcloth.  5.  Apply the CHG Soap to your body ONLY FROM THE NECK DOWN.        Do not use on open wounds or open sores.  Avoid contact with your eyes, ears, mouth and genitals (private parts).  Wash genitals (private parts) with your normal  soap.  6.  Wash thoroughly, paying special attention to the area where your surgery        will be performed.  7.  Thoroughly rinse your body with warm water from the neck down.  8.  DO NOT shower/wash with your normal soap after using and rinsing off       the CHG Soap.  9.  Pat yourself dry with a clean towel.            10.  Wear clean pajamas.            11.  Place clean sheets on your bed the night of your first shower and do not        sleep with pets.  Day of Surgery  Do not apply any lotions/deodorants the morning of surgery.  Please wear clean clothes to the hospital/surgery center.     Please read over the following fact sheets that you were given: Pain Booklet, Coughing and Deep Breathing, Blood Transfusion Information, MRSA Information and Surgical Site Infection Prevention

## 2013-10-17 MED ORDER — VANCOMYCIN HCL IN DEXTROSE 1-5 GM/200ML-% IV SOLN
1000.0000 mg | INTRAVENOUS | Status: AC
  Administered 2013-10-18: 1000 mg via INTRAVENOUS
  Filled 2013-10-17: qty 200

## 2013-10-17 NOTE — Progress Notes (Signed)
Anesthesia chart review: Patient is a 80 year old male scheduled for right VATS, right upper lobectomy, possible right middle lobectomy on 10/18/13 by Dr. Roxan Hockey.   History includes right upper lobe lung adenocarcinoma, former smoker, CAD s/p PTCA/stent of mid LCX and mid RCA 04/26/08 with admission for chest pain 08/2013 with non-ischemic stress test, hyperlipidemia, chronic kidney disease stage II, BPH, arthritis, lumbar laminectomy, left TKR. Cardiologist is Dr. Harl Bowie. PCP is listed as Dr. [name].  EKG on 10/16/13 showed NSR, cannot rule out anterior infarct (age undetermined). It was not felt significantly changed since last tracing.  Nuclear stress test on 08/23/13 showed: 1. Negative Lexiscan MPI for ischemia  2. Normal LV systolic function with normal wall motion, EF 67%. 3. Low risk study for major cardiac events.  Cardiac cath on 04/26/08 showed: Coronaries: Left main had 25% stenosis. The LAD had proximal 25% followed by tandem mid 25% lesion. The LAD wrapped the apex. There was  small first diagonal, which was free of high-grade disease. A moderate-sized second diagonal had a long ostial 30% stenosis. The circumflex in the AV groove had diffuse luminal irregularities. The distal vessel before a large second posterolateral had a focal 99% stenosis. First obtuse marginal was large with ostial 25% stenosis. Second obtuse  marginal was moderate-sized with ostial 40% stenosis. First posterolateral was moderate size and normal. Second posterolateral as described, was compromised by the 99% lesion in the AV groove medially proximal to the takeoff of this vessel. The vessel was large without high-grade stenosis. Right coronary artery is a dominant vessel. There was a long proximal 50% stenosis, terminating as a long 80% lesion before the RV branch. There was a PDA, which was large and normal. He subsequently underwent PTCA/stent of mid LCX and mid RCA.   Preoperative CXR, PFTs, and labs  noted.   Patient with recent non-ischemic stress test.  If no acute changes then I would anticipate that he can proceed as planned.  George Hugh [facility] Short Stay Center/Anesthesiology Phone [phone] 10/17/2013 10:30 AM

## 2013-10-18 ENCOUNTER — Encounter (HOSPITAL_COMMUNITY)
Admission: RE | Disposition: A | Discharge status: Home / Self Care | Payer: Self-pay | Source: Ambulatory Visit | Attending: Thoracic Surgery (Cardiothoracic Vascular Surgery)

## 2013-10-18 ENCOUNTER — Inpatient Hospital Stay (HOSPITAL_COMMUNITY): Payer: Medicare Other

## 2013-10-18 ENCOUNTER — Encounter (HOSPITAL_COMMUNITY): Payer: Self-pay | Admitting: *Deleted

## 2013-10-18 ENCOUNTER — Inpatient Hospital Stay (HOSPITAL_COMMUNITY)
Admission: RE | Admit: 2013-10-18 | Discharge: 2013-10-23 | DRG: 164 | Disposition: A | Discharge status: Home / Self Care | Payer: Medicare Other | Source: Ambulatory Visit | Attending: Thoracic Surgery (Cardiothoracic Vascular Surgery) | Admitting: Thoracic Surgery (Cardiothoracic Vascular Surgery)

## 2013-10-18 ENCOUNTER — Inpatient Hospital Stay (HOSPITAL_COMMUNITY): Payer: Medicare Other | Admitting: Certified Registered"

## 2013-10-18 ENCOUNTER — Encounter (HOSPITAL_COMMUNITY): Payer: Medicare Other | Admitting: Vascular Surgery

## 2013-10-18 DIAGNOSIS — Z9861 Coronary angioplasty status: Secondary | ICD-10-CM

## 2013-10-18 DIAGNOSIS — Z902 Acquired absence of lung [part of]: Secondary | ICD-10-CM

## 2013-10-18 DIAGNOSIS — Z87891 Personal history of nicotine dependence: Secondary | ICD-10-CM

## 2013-10-18 DIAGNOSIS — C3490 Malignant neoplasm of unspecified part of unspecified bronchus or lung: Secondary | ICD-10-CM

## 2013-10-18 DIAGNOSIS — E785 Hyperlipidemia, unspecified: Secondary | ICD-10-CM | POA: Diagnosis present

## 2013-10-18 DIAGNOSIS — Z96659 Presence of unspecified artificial knee joint: Secondary | ICD-10-CM

## 2013-10-18 DIAGNOSIS — I2510 Atherosclerotic heart disease of native coronary artery without angina pectoris: Secondary | ICD-10-CM | POA: Diagnosis present

## 2013-10-18 DIAGNOSIS — N184 Chronic kidney disease, stage 4 (severe): Secondary | ICD-10-CM | POA: Diagnosis present

## 2013-10-18 DIAGNOSIS — Z7982 Long term (current) use of aspirin: Secondary | ICD-10-CM

## 2013-10-18 DIAGNOSIS — J9819 Other pulmonary collapse: Secondary | ICD-10-CM | POA: Diagnosis not present

## 2013-10-18 DIAGNOSIS — C801 Malignant (primary) neoplasm, unspecified: Secondary | ICD-10-CM

## 2013-10-18 DIAGNOSIS — C3410 Malignant neoplasm of upper lobe, unspecified bronchus or lung: Principal | ICD-10-CM | POA: Diagnosis present

## 2013-10-18 HISTORY — PX: VIDEO ASSISTED THORACOSCOPY (VATS)/ LOBECTOMY: SHX6169

## 2013-10-18 LAB — PREPARE RBC (CROSSMATCH)

## 2013-10-18 SURGERY — VIDEO ASSISTED THORACOSCOPY (VATS)/ LOBECTOMY
Anesthesia: General | Site: Chest | Laterality: Right

## 2013-10-18 MED ORDER — FENTANYL 10 MCG/ML IV SOLN
INTRAVENOUS | Status: DC
  Administered 2013-10-18 – 2013-10-19 (×2): 10 ug via INTRAVENOUS
  Administered 2013-10-19: 30 ug via INTRAVENOUS
  Administered 2013-10-19 (×2): 10 ug via INTRAVENOUS
  Administered 2013-10-20: 30 ug via INTRAVENOUS
  Administered 2013-10-20: 10 ug via INTRAVENOUS
  Administered 2013-10-20: 30 ug via INTRAVENOUS
  Administered 2013-10-21: 10 ug via INTRAVENOUS
  Filled 2013-10-18: qty 50

## 2013-10-18 MED ORDER — LIDOCAINE HCL (CARDIAC) 20 MG/ML IV SOLN
INTRAVENOUS | Status: AC
  Filled 2013-10-18: qty 5

## 2013-10-18 MED ORDER — ALBUMIN HUMAN 5 % IV SOLN
INTRAVENOUS | Status: DC | PRN
  Administered 2013-10-18: 15:00:00 via INTRAVENOUS

## 2013-10-18 MED ORDER — SODIUM CHLORIDE 0.9 % IJ SOLN
INTRAMUSCULAR | Status: AC
  Filled 2013-10-18: qty 10

## 2013-10-18 MED ORDER — GLYCOPYRROLATE 0.2 MG/ML IJ SOLN
INTRAMUSCULAR | Status: AC
  Filled 2013-10-18: qty 2

## 2013-10-18 MED ORDER — ASPIRIN 81 MG PO TBEC: 162.0000 mg | DELAYED_RELEASE_TABLET | Freq: Two times a day (BID) | ORAL | Status: DC

## 2013-10-18 MED ORDER — 0.9 % SODIUM CHLORIDE (POUR BTL) OPTIME
TOPICAL | Status: DC | PRN
  Administered 2013-10-18: 3000 mL

## 2013-10-18 MED ORDER — LIDOCAINE HCL (CARDIAC) 20 MG/ML IV SOLN
INTRAVENOUS | Status: DC | PRN
  Administered 2013-10-18: 40 mg via INTRAVENOUS

## 2013-10-18 MED ORDER — BUPIVACAINE HCL (PF) 0.5 % IJ SOLN
INTRAMUSCULAR | Status: DC | PRN
  Administered 2013-10-18: 10 mL

## 2013-10-18 MED ORDER — EPHEDRINE SULFATE 50 MG/ML IJ SOLN
INTRAMUSCULAR | Status: DC | PRN
  Administered 2013-10-18: 10 mg via INTRAVENOUS
  Administered 2013-10-18: 2.5 mg via INTRAVENOUS
  Administered 2013-10-18: 10 mg via INTRAVENOUS
  Administered 2013-10-18 (×2): 2.5 mg via INTRAVENOUS
  Administered 2013-10-18: 5 mg via INTRAVENOUS
  Administered 2013-10-18 (×2): 2.5 mg via INTRAVENOUS
  Administered 2013-10-18: 5 mg via INTRAVENOUS
  Administered 2013-10-18: 2.5 mg via INTRAVENOUS

## 2013-10-18 MED ORDER — DIPHENHYDRAMINE HCL 50 MG/ML IJ SOLN
12.5000 mg | Freq: Four times a day (QID) | INTRAMUSCULAR | Status: DC | PRN
  Filled 2013-10-18: qty 0.25

## 2013-10-18 MED ORDER — VECURONIUM BROMIDE 10 MG IV SOLR
INTRAVENOUS | Status: DC | PRN
  Administered 2013-10-18: 2 mg via INTRAVENOUS
  Administered 2013-10-18: 1 mg via INTRAVENOUS
  Administered 2013-10-18: .5 mg via INTRAVENOUS
  Administered 2013-10-18 (×2): 1 mg via INTRAVENOUS

## 2013-10-18 MED ORDER — EPHEDRINE SULFATE 50 MG/ML IJ SOLN
INTRAMUSCULAR | Status: AC
  Filled 2013-10-18: qty 1

## 2013-10-18 MED ORDER — GLYCOPYRROLATE 0.2 MG/ML IJ SOLN
INTRAMUSCULAR | Status: DC | PRN
  Administered 2013-10-18: 0.4 mg via INTRAVENOUS

## 2013-10-18 MED ORDER — NEOSTIGMINE METHYLSULFATE 10 MG/10ML IV SOLN
INTRAVENOUS | Status: DC | PRN
  Administered 2013-10-18: 3 mg via INTRAVENOUS

## 2013-10-18 MED ORDER — FENTANYL CITRATE 0.05 MG/ML IJ SOLN
INTRAMUSCULAR | Status: AC
  Filled 2013-10-18: qty 5

## 2013-10-18 MED ORDER — FENTANYL CITRATE 0.05 MG/ML IJ SOLN
INTRAMUSCULAR | Status: DC | PRN
  Administered 2013-10-18 (×2): 100 ug via INTRAVENOUS
  Administered 2013-10-18 (×2): 50 ug via INTRAVENOUS

## 2013-10-18 MED ORDER — MIDAZOLAM HCL 2 MG/2ML IJ SOLN
INTRAMUSCULAR | Status: AC
  Administered 2013-10-18: 1 mg
  Filled 2013-10-18: qty 2

## 2013-10-18 MED ORDER — ALBUTEROL SULFATE (2.5 MG/3ML) 0.083% IN NEBU
2.5000 mg | INHALATION_SOLUTION | RESPIRATORY_TRACT | Status: DC
  Administered 2013-10-18 – 2013-10-19 (×3): 2.5 mg via RESPIRATORY_TRACT
  Filled 2013-10-18 (×3): qty 3

## 2013-10-18 MED ORDER — BUPIVACAINE 0.5 % ON-Q PUMP SINGLE CATH 400 ML
INJECTION | Status: DC | PRN
  Administered 2013-10-18: 400 mL

## 2013-10-18 MED ORDER — PHENYLEPHRINE HCL 10 MG/ML IJ SOLN
10.0000 mg | INTRAVENOUS | Status: DC | PRN
  Administered 2013-10-18: 50 ug/min via INTRAVENOUS

## 2013-10-18 MED ORDER — STERILE WATER FOR INJECTION IJ SOLN
INTRAMUSCULAR | Status: AC
  Filled 2013-10-18: qty 10

## 2013-10-18 MED ORDER — LEVALBUTEROL HCL 0.63 MG/3ML IN NEBU: 0.6300 mg | INHALATION_SOLUTION | Freq: Four times a day (QID) | RESPIRATORY_TRACT | Status: DC

## 2013-10-18 MED ORDER — ROCURONIUM BROMIDE 100 MG/10ML IV SOLN
INTRAVENOUS | Status: DC | PRN
  Administered 2013-10-18: 50 mg via INTRAVENOUS

## 2013-10-18 MED ORDER — VANCOMYCIN HCL IN DEXTROSE 1-5 GM/200ML-% IV SOLN
1000.0000 mg | Freq: Two times a day (BID) | INTRAVENOUS | Status: AC
  Administered 2013-10-18: 1000 mg via INTRAVENOUS
  Filled 2013-10-18: qty 200

## 2013-10-18 MED ORDER — LACTATED RINGERS IV SOLN
INTRAVENOUS | Status: DC
  Administered 2013-10-18 (×2): via INTRAVENOUS

## 2013-10-18 MED ORDER — ASPIRIN EC 81 MG PO TBEC
162.0000 mg | DELAYED_RELEASE_TABLET | Freq: Two times a day (BID) | ORAL | Status: DC
  Administered 2013-10-19 – 2013-10-21 (×6): 162 mg via ORAL
  Administered 2013-10-22: 81 mg via ORAL
  Filled 2013-10-18 (×8): qty 2

## 2013-10-18 MED ORDER — AMLODIPINE BESYLATE 5 MG PO TABS
5.0000 mg | ORAL_TABLET | Freq: Every day | ORAL | Status: DC
  Administered 2013-10-19: 5 mg via ORAL
  Filled 2013-10-18 (×2): qty 1

## 2013-10-18 MED ORDER — CALCIUM CHLORIDE 10 % IV SOLN
INTRAVENOUS | Status: DC | PRN
  Administered 2013-10-18: 500 mg via INTRAVENOUS

## 2013-10-18 MED ORDER — ACETAMINOPHEN 160 MG/5ML PO SOLN
1000.0000 mg | Freq: Four times a day (QID) | ORAL | Status: DC
  Filled 2013-10-18: qty 40
  Filled 2013-10-18: qty 40.6

## 2013-10-18 MED ORDER — BUPIVACAINE 0.5 % ON-Q PUMP SINGLE CATH 400 ML
400.0000 mL | INJECTION | Status: DC
  Filled 2013-10-18: qty 400

## 2013-10-18 MED ORDER — POTASSIUM CHLORIDE 10 MEQ/50ML IV SOLN
10.0000 meq | Freq: Every day | INTRAVENOUS | Status: DC | PRN
  Filled 2013-10-18: qty 50

## 2013-10-18 MED ORDER — DIPHENHYDRAMINE HCL 12.5 MG/5ML PO ELIX
12.5000 mg | ORAL_SOLUTION | Freq: Four times a day (QID) | ORAL | Status: DC | PRN
  Filled 2013-10-18: qty 5

## 2013-10-18 MED ORDER — ONDANSETRON HCL 4 MG/2ML IJ SOLN
INTRAMUSCULAR | Status: AC
  Filled 2013-10-18: qty 2

## 2013-10-18 MED ORDER — VECURONIUM BROMIDE 10 MG IV SOLR
INTRAVENOUS | Status: AC
  Filled 2013-10-18: qty 10

## 2013-10-18 MED ORDER — ALBUTEROL SULFATE (2.5 MG/3ML) 0.083% IN NEBU: 2.5000 mg | INHALATION_SOLUTION | RESPIRATORY_TRACT | Status: DC

## 2013-10-18 MED ORDER — HEMOSTATIC AGENTS (NO CHARGE) OPTIME
TOPICAL | Status: DC | PRN
  Administered 2013-10-18 (×2): 1 via TOPICAL

## 2013-10-18 MED ORDER — TAMSULOSIN HCL 0.4 MG PO CAPS
0.4000 mg | ORAL_CAPSULE | Freq: Every evening | ORAL | Status: DC
  Administered 2013-10-19 – 2013-10-22 (×4): 0.4 mg via ORAL
  Filled 2013-10-18 (×6): qty 1

## 2013-10-18 MED ORDER — FENTANYL CITRATE 0.05 MG/ML IJ SOLN
INTRAMUSCULAR | Status: AC
  Administered 2013-10-18: 50 ug
  Filled 2013-10-18: qty 2

## 2013-10-18 MED ORDER — NALOXONE HCL 0.4 MG/ML IJ SOLN
0.4000 mg | INTRAMUSCULAR | Status: DC | PRN
  Filled 2013-10-18: qty 1

## 2013-10-18 MED ORDER — SODIUM CHLORIDE 0.9 % IJ SOLN: 9.0000 mL | INTRAMUSCULAR | Status: DC | PRN

## 2013-10-18 MED ORDER — SENNOSIDES-DOCUSATE SODIUM 8.6-50 MG PO TABS
1.0000 | ORAL_TABLET | Freq: Every day | ORAL | Status: DC
  Administered 2013-10-18 – 2013-10-21 (×4): 1 via ORAL
  Filled 2013-10-18 (×6): qty 1

## 2013-10-18 MED ORDER — PROPOFOL 10 MG/ML IV BOLUS
INTRAVENOUS | Status: AC
  Filled 2013-10-18: qty 20

## 2013-10-18 MED ORDER — BISACODYL 5 MG PO TBEC
10.0000 mg | DELAYED_RELEASE_TABLET | Freq: Every day | ORAL | Status: DC
  Administered 2013-10-19 – 2013-10-22 (×4): 10 mg via ORAL
  Filled 2013-10-18 (×4): qty 2

## 2013-10-18 MED ORDER — PHENYLEPHRINE HCL 10 MG/ML IJ SOLN
INTRAMUSCULAR | Status: DC | PRN
  Administered 2013-10-18 (×5): 80 ug via INTRAVENOUS

## 2013-10-18 MED ORDER — PROPOFOL 10 MG/ML IV BOLUS
INTRAVENOUS | Status: DC | PRN
  Administered 2013-10-18: 110 mg via INTRAVENOUS

## 2013-10-18 MED ORDER — ONDANSETRON HCL 4 MG/2ML IJ SOLN
INTRAMUSCULAR | Status: DC | PRN
  Administered 2013-10-18: 4 mg via INTRAVENOUS

## 2013-10-18 MED ORDER — TRAMADOL HCL 50 MG PO TABS: 50.0000 mg | ORAL_TABLET | Freq: Four times a day (QID) | ORAL | Status: DC | PRN

## 2013-10-18 MED ORDER — ONDANSETRON HCL 4 MG/2ML IJ SOLN: 4.0000 mg | Freq: Four times a day (QID) | INTRAMUSCULAR | Status: DC | PRN

## 2013-10-18 MED ORDER — CARVEDILOL 12.5 MG PO TABS
12.5000 mg | ORAL_TABLET | Freq: Two times a day (BID) | ORAL | Status: DC
  Administered 2013-10-19 – 2013-10-23 (×9): 12.5 mg via ORAL
  Filled 2013-10-18 (×11): qty 1

## 2013-10-18 MED ORDER — BUPIVACAINE HCL (PF) 0.5 % IJ SOLN
INTRAMUSCULAR | Status: AC
  Filled 2013-10-18: qty 10

## 2013-10-18 MED ORDER — ONDANSETRON HCL 4 MG/2ML IJ SOLN
4.0000 mg | Freq: Four times a day (QID) | INTRAMUSCULAR | Status: DC | PRN
  Administered 2013-10-19 (×2): 4 mg via INTRAVENOUS
  Filled 2013-10-18 (×2): qty 2

## 2013-10-18 MED ORDER — DEXTROSE-NACL 5-0.9 % IV SOLN
INTRAVENOUS | Status: DC
  Administered 2013-10-19: 11:00:00 via INTRAVENOUS
  Administered 2013-10-20: 50 mL/h via INTRAVENOUS
  Administered 2013-10-21: 04:00:00 via INTRAVENOUS

## 2013-10-18 MED ORDER — NEOSTIGMINE METHYLSULFATE 10 MG/10ML IV SOLN
INTRAVENOUS | Status: AC
  Filled 2013-10-18: qty 1

## 2013-10-18 MED ORDER — ACETAMINOPHEN 500 MG PO TABS
1000.0000 mg | ORAL_TABLET | Freq: Four times a day (QID) | ORAL | Status: DC
  Administered 2013-10-19 – 2013-10-23 (×13): 1000 mg via ORAL
  Filled 2013-10-18 (×19): qty 2

## 2013-10-18 SURGICAL SUPPLY — 97 items
ADH SKN CLS APL DERMABOND .7 (GAUZE/BANDAGES/DRESSINGS) ×1
APL SKNCLS STERI-STRIP NONHPOA (GAUZE/BANDAGES/DRESSINGS) ×1
APPLIER CLIP ROT 10 11.4 M/L (STAPLE) ×3
APR CLP MED LRG 11.4X10 (STAPLE) ×1
BAG SPEC RTRVL LRG 6X4 10 (ENDOMECHANICALS)
BENZOIN TINCTURE PRP APPL 2/3 (GAUZE/BANDAGES/DRESSINGS) ×2 IMPLANT
BLADE 10 SAFETY STRL DISP (BLADE) ×3 IMPLANT
CANISTER SUCTION 2500CC (MISCELLANEOUS) ×6 IMPLANT
CATH KIT ON Q 5IN SLV (PAIN MANAGEMENT) IMPLANT
CATH THORACIC 28FR (CATHETERS) ×2 IMPLANT
CATH THORACIC 28FR RT ANG (CATHETERS) ×3 IMPLANT
CATH THORACIC 36FR (CATHETERS) IMPLANT
CATH THORACIC 36FR RT ANG (CATHETERS) ×3 IMPLANT
CLIP APPLIE ROT 10 11.4 M/L (STAPLE) IMPLANT
CLIP TI MEDIUM 6 (CLIP) ×3 IMPLANT
CLOSURE STERI-STRIP 1/2X4 (GAUZE/BANDAGES/DRESSINGS) ×1
CLSR STERI-STRIP ANTIMIC 1/2X4 (GAUZE/BANDAGES/DRESSINGS) ×1 IMPLANT
CONN ST 1/4X3/8  BEN (MISCELLANEOUS)
CONN ST 1/4X3/8 BEN (MISCELLANEOUS) IMPLANT
CONN Y 3/8X3/8X3/8  BEN (MISCELLANEOUS) ×2
CONN Y 3/8X3/8X3/8 BEN (MISCELLANEOUS) ×1 IMPLANT
CONT SPEC 4OZ CLIKSEAL STRL BL (MISCELLANEOUS) ×26 IMPLANT
COVER SURGICAL LIGHT HANDLE (MISCELLANEOUS) ×3 IMPLANT
DERMABOND ADVANCED (GAUZE/BANDAGES/DRESSINGS) ×2
DERMABOND ADVANCED .7 DNX12 (GAUZE/BANDAGES/DRESSINGS) ×1 IMPLANT
DRAPE LAPAROSCOPIC ABDOMINAL (DRAPES) ×3 IMPLANT
DRAPE WARM FLUID 44X44 (DRAPE) ×6 IMPLANT
DRSG TEGADERM 2-3/8X2-3/4 SM (GAUZE/BANDAGES/DRESSINGS) ×2 IMPLANT
ELECT REM PT RETURN 9FT ADLT (ELECTROSURGICAL) ×3
ELECTRODE REM PT RTRN 9FT ADLT (ELECTROSURGICAL) ×1 IMPLANT
GLOVE BIOGEL M 7.0 STRL (GLOVE) ×2 IMPLANT
GLOVE BIOGEL PI IND STRL 6.5 (GLOVE) IMPLANT
GLOVE BIOGEL PI IND STRL 7.0 (GLOVE) IMPLANT
GLOVE BIOGEL PI INDICATOR 6.5 (GLOVE) ×4
GLOVE BIOGEL PI INDICATOR 7.0 (GLOVE) ×2
GLOVE SURG SIGNA 7.5 PF LTX (GLOVE) ×6 IMPLANT
GLOVE SURG SS PI 7.0 STRL IVOR (GLOVE) ×2 IMPLANT
GOWN STRL REUS W/ TWL LRG LVL3 (GOWN DISPOSABLE) ×2 IMPLANT
GOWN STRL REUS W/ TWL XL LVL3 (GOWN DISPOSABLE) ×1 IMPLANT
GOWN STRL REUS W/TWL LRG LVL3 (GOWN DISPOSABLE) ×6
GOWN STRL REUS W/TWL XL LVL3 (GOWN DISPOSABLE) ×9
HANDLE STAPLE ENDO GIA SHORT (STAPLE) ×2
HEMOSTAT SURGICEL 2X14 (HEMOSTASIS) ×4 IMPLANT
KIT BASIN OR (CUSTOM PROCEDURE TRAY) ×3 IMPLANT
KIT ROOM TURNOVER OR (KITS) ×3 IMPLANT
KIT SUCTION CATH 14FR (SUCTIONS) ×3 IMPLANT
NS IRRIG 1000ML POUR BTL (IV SOLUTION) ×6 IMPLANT
PACK CHEST (CUSTOM PROCEDURE TRAY) ×3 IMPLANT
PAD ARMBOARD 7.5X6 YLW CONV (MISCELLANEOUS) ×6 IMPLANT
POUCH ENDO CATCH II 15MM (MISCELLANEOUS) ×2 IMPLANT
POUCH SPECIMEN RETRIEVAL 10MM (ENDOMECHANICALS) IMPLANT
RELOAD EGIA 45 MED/THCK PURPLE (STAPLE) ×4 IMPLANT
RELOAD EGIA 45 TAN VASC (STAPLE) ×2 IMPLANT
RELOAD EGIA 60 MED/THCK PURPLE (STAPLE) ×3 IMPLANT
RELOAD EGIA TRIS TAN 45 CVD (STAPLE) ×15 IMPLANT
RELOAD ENDO GIA 30 3.5 (STAPLE) ×3 IMPLANT
RELOAD STAPLE 45 TAN MED CVD (STAPLE) IMPLANT
RELOAD STAPLE 60 MED/THCK ART (STAPLE) IMPLANT
SEALANT PROGEL (MISCELLANEOUS) IMPLANT
SEALANT SURG COSEAL 4ML (VASCULAR PRODUCTS) IMPLANT
SEALANT SURG COSEAL 8ML (VASCULAR PRODUCTS) IMPLANT
SOLUTION ANTI FOG 6CC (MISCELLANEOUS) ×6 IMPLANT
SPECIMEN JAR MEDIUM (MISCELLANEOUS) ×3 IMPLANT
SPONGE GAUZE 4X4 12PLY (GAUZE/BANDAGES/DRESSINGS) ×3 IMPLANT
SPONGE GAUZE 4X4 12PLY STER LF (GAUZE/BANDAGES/DRESSINGS) ×2 IMPLANT
SPONGE INTESTINAL PEANUT (DISPOSABLE) ×12 IMPLANT
STAPLER ENDO GIA 12 SHRT THIN (STAPLE) IMPLANT
STAPLER ENDO GIA 12MM SHORT (STAPLE) ×1 IMPLANT
SUT PROLENE 4 0 RB 1 (SUTURE) ×3
SUT PROLENE 4-0 RB1 .5 CRCL 36 (SUTURE) IMPLANT
SUT SILK  1 MH (SUTURE) ×8
SUT SILK 1 MH (SUTURE) ×2 IMPLANT
SUT SILK 2 0SH CR/8 30 (SUTURE) IMPLANT
SUT SILK 3 0SH CR/8 30 (SUTURE) ×2 IMPLANT
SUT VIC AB 1 CTX 36 (SUTURE) ×3
SUT VIC AB 1 CTX36XBRD ANBCTR (SUTURE) IMPLANT
SUT VIC AB 2-0 CTX 36 (SUTURE) ×3 IMPLANT
SUT VIC AB 2-0 UR6 27 (SUTURE) ×2 IMPLANT
SUT VIC AB 3-0 MH 27 (SUTURE) IMPLANT
SUT VIC AB 3-0 X1 27 (SUTURE) ×6 IMPLANT
SUT VICRYL 2 TP 1 (SUTURE) ×2 IMPLANT
SWAB COLLECTION DEVICE MRSA (MISCELLANEOUS) IMPLANT
SYSTEM SAHARA CHEST DRAIN ATS (WOUND CARE) ×3 IMPLANT
TAPE CLOTH 4X10 WHT NS (GAUZE/BANDAGES/DRESSINGS) ×3 IMPLANT
TAPE CLOTH SURG 4X10 WHT LF (GAUZE/BANDAGES/DRESSINGS) ×3 IMPLANT
TIP APPLICATOR SPRAY EXTEND 16 (VASCULAR PRODUCTS) IMPLANT
TOWEL OR 17X24 6PK STRL BLUE (TOWEL DISPOSABLE) ×3 IMPLANT
TOWEL OR 17X26 10 PK STRL BLUE (TOWEL DISPOSABLE) ×3 IMPLANT
TRAP SPECIMEN MUCOUS 40CC (MISCELLANEOUS) IMPLANT
TRAY FOLEY CATH 16FRSI W/METER (SET/KITS/TRAYS/PACK) ×3 IMPLANT
TROCAR XCEL BLADELESS 5X75MML (TROCAR) ×3 IMPLANT
TUBE ANAEROBIC SPECIMEN COL (MISCELLANEOUS) IMPLANT
TUBE CONNECTING 12'X1/4 (SUCTIONS) ×2
TUBE CONNECTING 12X1/4 (SUCTIONS) ×2 IMPLANT
TUNNELER SHEATH ON-Q 11GX8 DSP (PAIN MANAGEMENT) ×2 IMPLANT
WATER STERILE IRR 1000ML POUR (IV SOLUTION) ×6 IMPLANT
YANKAUER SUCT BULB TIP NO VENT (SUCTIONS) ×4 IMPLANT

## 2013-10-18 NOTE — Anesthesia Procedure Notes (Addendum)
Procedure Name: Intubation Date/Time: 10/18/2013 10:46 AM Performed by: Manuela Schwartz B Pre-anesthesia Checklist: Patient identified, Emergency Drugs available, Suction available, Patient being monitored and Timeout performed Patient Re-evaluated:Patient Re-evaluated prior to inductionOxygen Delivery Method: Circle system utilized Preoxygenation: Pre-oxygenation with 100% oxygen Intubation Type: IV induction Ventilation: Mask ventilation without difficulty and Oral airway inserted - appropriate to patient size Laryngoscope Size: Mac and 3 Grade View: Grade I Endobronchial tube: Left, Double lumen EBT and EBT position confirmed by auscultation and 39 Fr Number of attempts: 1 Airway Equipment and Method: Stylet Placement Confirmation: ETT inserted through vocal cords under direct vision,  positive ETCO2 and breath sounds checked- equal and bilateral Tube secured with: Tape Dental Injury: Teeth and Oropharynx as per pre-operative assessment

## 2013-10-18 NOTE — Interval H&P Note (Signed)
History and Physical Interval Note:  PFTs OK FEV1 2.11  10/18/2013 10:02 AM  Henry Hudson  has presented today for surgery, with the diagnosis of RUL ADENOCARCINOMA  The various methods of treatment have been discussed with the patient and family. After consideration of risks, benefits and other options for treatment, the patient has consented to  Procedure(s): VIDEO ASSISTED THORACOSCOPY (VATS)/ RUL LOBECTOMY//POSSIBLE RML LOBECTOMY (Right) as a surgical intervention .  The patient's history has been reviewed, patient examined, no change in status, stable for surgery.  I have reviewed the patient's chart and labs.  Questions were answered to the patient's satisfaction.     Melrose Nakayama

## 2013-10-18 NOTE — Anesthesia Preprocedure Evaluation (Addendum)
Anesthesia Evaluation  Patient identified by MRN, date of birth, ID band Patient awake    Reviewed: Allergy & Precautions, H&P , NPO status , Patient's Chart, lab work & pertinent test results, reviewed documented beta blocker date and time   History of Anesthesia Complications Negative for: history of anesthetic complications  Airway Mallampati: I TM Distance: >3 FB Neck ROM: Full    Dental  (+) Edentulous Upper, Edentulous Lower   Pulmonary former smoker,    + decreased breath sounds      Cardiovascular hypertension, Pt. on medications and Pt. on home beta blockers + CAD and + Cardiac Stents Rhythm:Regular Rate:Normal     Neuro/Psych    GI/Hepatic   Endo/Other    Renal/GU Renal InsufficiencyRenal disease     Musculoskeletal   Abdominal   Peds  Hematology   Anesthesia Other Findings   Reproductive/Obstetrics                          Anesthesia Physical Anesthesia Plan  ASA: III  Anesthesia Plan: General   Post-op Pain Management:    Induction: Intravenous  Airway Management Planned: Double Lumen EBT  Additional Equipment: Arterial line and CVP  Intra-op Plan:   Post-operative Plan: Extubation in OR  Informed Consent: I have reviewed the patients History and Physical, chart, labs and discussed the procedure including the risks, benefits and alternatives for the proposed anesthesia with the patient or authorized representative who has indicated his/her understanding and acceptance.   Dental advisory given  Plan Discussed with: Anesthesiologist and Surgeon  Anesthesia Plan Comments:        Anesthesia Quick Evaluation

## 2013-10-18 NOTE — Anesthesia Postprocedure Evaluation (Signed)
  Anesthesia Post-op Note  Patient: Henry Hudson  Procedure(s) Performed: Procedure(s): RIGHT VIDEO ASSISTED THORACOSCOPY RIGHT UPPER LOBECTOMY LUNG &  RIGHT MIDDLE LOBECTOMY LUNG,& NODE DISSECTION  (Right)  Patient Location: PACU  Anesthesia Type:General  Level of Consciousness: awake, oriented, sedated and patient cooperative  Airway and Oxygen Therapy: Patient Spontanous Breathing  Post-op Pain: mild  Post-op Assessment: Post-op Vital signs reviewed, Patient's Cardiovascular Status Stable, Respiratory Function Stable, Patent Airway, No signs of Nausea or vomiting and Pain level controlled  Post-op Vital Signs: stable  Last Vitals:  Filed Vitals:   10/18/13 1658  BP:   Pulse:   Temp:   Resp: 15    Complications: No apparent anesthesia complications

## 2013-10-18 NOTE — Progress Notes (Signed)
[facility] BRIEF SICU PROGRESS NOTE  Day of Surgery  S/P Procedure(s) (LRB): RIGHT VIDEO ASSISTED THORACOSCOPY RIGHT UPPER LOBECTOMY LUNG &  RIGHT MIDDLE LOBECTOMY LUNG,& NODE DISSECTION  (Right)   Some expected soreness in chest but adequate analgesia Breathing comfortably w/ O2 sats 100% Chest tube output low, no air leak UOP adequate  Plan: Continue routine early postop  Rexene Alberts 10/18/2013 7:36 PM

## 2013-10-18 NOTE — H&P (View-Only) (Signed)
PCP is Henry Percy, MD Referring Provider is Henry Percy, MD  Chief Complaint  Patient presents with  . Lung Cancer    Surgical eval, MRI Brain 10/06/13, PET Scan 09/08/13, CT BX 09/21/13    HPI: 80 year old gentleman presents with chief complaint of a lung mass.  [name] is an 79 year old gentleman who had atypical chest pain in March. He was evaluated for that with a cardiac workup including EKG and a nuclear stress test. It was determined to be noncardiac in origin. He also had a chest x-ray which showed a right upper lobe mass. This led to a CT scan which confirmed a 2.3 x 2.1 x 1.8 cm mass in the right upper lobe abutting the minor fissure. There were some additional small nodules along the major fissure all less than a centimeter. He then had a PET/CT which showed the mass was hypermetabolic with an SUV of 5.4. There was no evidence of regional or distant metastases. The other, subcentimeter pulmonary nodules were not hypermetabolic.  A needle biopsy was done on April 9 which showed adenocarcinoma. He was referred to Dr. Marco Collie, and now is referred for consideration for surgery.  He says that he has been feeling well other than this right-sided chest pain. He says that it is in the midportion of his chest next to his breastbone. It comes on sporadically and is not associated with exertion. It is not in any way similar to the anginal pain he had in 2009 prior to having stents placed in his circumflex and RCA. That pain was a severe squeezing or tightness in the center of his chest. He says it felt like something had a hold on him and would not let go. He has not had any pain similar to that since his stents were placed. He denies cough, hemoptysis, wheezing, and shortness of breath except with extreme exertion. He has not had any new bone or joint pain. He denies any unusual headaches or visual changes.   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 He can  walk up 2 flights of stairs without shortness of breath unless he is in a hurry.  He smoked one pack a day from age 41. He quit about 20 years ago (40 pack years)  No weight loss over past 6 months  Zubrod Score: At the time of surgery this patient's most appropriate activity status/level should be described as: [x]     0    Normal activity, no symptoms []     1    Restricted in physical strenuous activity but ambulatory, able to do out light work []     2    Ambulatory and capable of self care, unable to do work activities, up and about >50 % of waking hours                              []     3    Only limited self care, in bed greater than 50% of waking hours []     4    Completely disabled, no self care, confined to bed or chair []     5    Moribund   Past Medical History  Diagnosis Date  . Chest pain, unspecified     2 Vessel Coronary artery Disease  . Intermediate coronary syndrome   . Unspecified hypertensive kidney disease with chronic kidney disease stage I through stage  IV, or unspecified   . Other and unspecified hyperlipidemia   . Hypopotassemia   . Other specified cardiac dysrhythmias(427.89)   . Chronic kidney disease, unspecified   . Personal history of tobacco use, presenting hazards to health   . Hypertension   . Coronary artery disease 2009    Past Surgical History  Procedure Laterality Date  . Remote lumbar laminectomy    . Left knee replacement      S.C.  . Stent placement 04/2008 x 2      Family History  Problem Relation Age of Onset  . Heart failure Mother 22  . COPD Mother   . Heart failure Father 67  . Cancer Brother     Social History History  Substance Use Topics  . Smoking status: Former Smoker -- 1.00 packs/day for 50 years    Types: Cigarettes    Quit date: 06/15/1993  . Smokeless tobacco: Never Used     Comment: QUIT 1995  . Alcohol Use: No    Current Outpatient Prescriptions  Medication Sig Dispense Refill  . amLODipine (NORVASC) 5  MG tablet Take 1 tablet (5 mg total) by mouth daily.  90 tablet  3  . aspirin EC 81 MG EC tablet Take 1 tablet (81 mg total) by mouth daily.      . carvedilol (COREG) 12.5 MG tablet Take 1 tablet (12.5 mg total) by mouth 2 (two) times daily with a meal.  180 tablet  3  . lisinopril (PRINIVIL,ZESTRIL) 20 MG tablet Take 1 tablet (20 mg total) by mouth daily.  90 tablet  3  . nitroGLYCERIN (NITROSTAT) 0.4 MG SL tablet Place 0.4 mg under the tongue every 5 (five) minutes as needed.        . tamsulosin (FLOMAX) 0.4 MG CAPS capsule Take 0.4 mg by mouth.        No current facility-administered medications for this visit.    Allergies  Allergen Reactions  . Penicillins     REACTION: Flushing  . Statins Other (See Comments)    Gout    Review of Systems  Constitutional: Negative for fever, chills, activity change and unexpected weight change.  Respiratory: Positive for shortness of breath (with heavy exertion). Negative for cough, chest tightness and wheezing.   Cardiovascular: Positive for chest pain (right anterior pain, "not like when I had my stents").  Genitourinary: Positive for urgency.  Musculoskeletal: Positive for arthralgias and back pain.  All other systems reviewed and are negative.   BP 139/71  Pulse 92  Resp 20  Ht 5\' 8"  (1.727 m)  Wt 185 lb (83.915 kg)  BMI 28.14 kg/m2  SpO2 97% Physical Exam  Vitals reviewed. Constitutional: He is oriented to person, place, and time. He appears well-developed and well-nourished. No distress.  HENT:  Head: Normocephalic and atraumatic.  Eyes: EOM are normal. Pupils are equal, round, and reactive to light.  Neck: Neck supple. No thyromegaly present.  Cardiovascular: Normal rate, regular rhythm and normal heart sounds.  Exam reveals no gallop and no friction rub.   No murmur heard. Pulmonary/Chest: Effort normal and breath sounds normal. He has no wheezes. He has no rales.  Abdominal: Soft. There is no tenderness.  Musculoskeletal: He  exhibits no edema.  Lymphadenopathy:    He has no cervical adenopathy.  Neurological: He is alert and oriented to person, place, and time. No cranial nerve deficit.  Skin: Skin is warm and dry.     Diagnostic Tests:  Myocardial SPECT 08/23/2013  IMPRESSION  1. Negative Lexiscan MPI for ischemia  2. Normal LV systolic function with normal wall motion  3. Low risk study for major cardiac events.  CT CHEST 08/21/2013  IMPRESSION:  1. There is an abnormal right upper lobe mass suspicious for  malignancy. There are other tiny nodules within both lungs which  measure less than 5 mm in diameter and which are nonspecific.  2. There is no evidence of mediastinal or hilar lymphadenopathy.  There is no pleural or pericardial effusion.  3. There is no evidence of CHF nor of pneumonia.  4. These results were called by telephone at the time of  interpretation on 08/21/2013 at 4:12 PM to Dr. Joseph Berkshire ,  who verbally acknowledged these results.  Electronically Signed:  By: David Martinique  On: 08/21/2013 16:14  ADDENDUM:  There is densely calcified plaque in the LAD and likely in the left  main coronary artery consistent with coronary atherosclerosis. .  Electronically Signed  By: David Martinique  On: 08/22/2013 09:55  PET/CT 09/08/2013  IMPRESSION:  1. Hypermetabolic right upper lobe mass consists with primary  bronchogenic carcinoma.  2. Small subpleural nodules along the right oblique fissure are  likely benign.  3. No mediastinal nodal metastasis or distant metastasis.  4. Staging by FDG PET imaging T1b N0 M0  Electronically Signed  By: Suzy Bouchard M.D.  On: 09/08/2013 17:04   Impression: 80 year old man with newly diagnosed adenocarcinoma of the right upper lobe. This is about 2-1/2 cm diameter mass. Does abut the minor fissure. There is no hilar or mediastinal adenopathy. PET/CT an MR of the brain showed no evidence of metastatic disease. This appears to be a stage I  lesion. Surgical resection is the treatment of choice.  He has not yet had pulmonary function testing, but judging by his activity level and fitness I do not think that will be an issue. We will schedule him for PFTs with a room air blood gas.  He does have a cardiac history including a stents placed in his circumflex and right coronary in 2009. He's been having some chest pain. However, he is very clear with his description of his previous angina and the current pain is not in any way similar to the anginal pain. He had a negative stress test a month ago. Given his lack of symptoms and negative stress test and I do not think a formal cardiology consult is necessary prior to surgery.  I had a long discussion with Mr. and Mrs. Mermelstein. We reviewed the CT and PET. We discussed the biopsy results. We discussed treatment options including surgical resection and radiation and/or chemotherapy. They understand that surgical resection of the treatment of choice if feasible. We discussed the nature of the proposed surgical procedure, including the need for general anesthesia, incisions be used, expected hospital stay, and the overall recovery. We discussed the indications, risks, benefits, and alternatives. They understand the risk include, but are not limited to death, MI, DVT, PE, bleeding, possible need for transfusion, infection, prolonged air leak, cardiac arrhythmias, as well as the possibility of unforeseeable complications. They do understand that surgery does not guarantee a cure. They also understand that additional adjuvant therapy may be necessary depending on pathologic findings.  He accepts the risks and wishes to proceed  Plan: Right VATS, right upper lobectomy, possible middle lobectomy on Wednesday, May 6

## 2013-10-18 NOTE — Brief Op Note (Addendum)
      [address]       [place] [address]             [phone]     10/18/2013  3:58 PM  PATIENT:  Henry Hudson  80 y.o. male  PRE-OPERATIVE DIAGNOSIS:  RIGHT UPPER LOBE ADENOCARCINOMA  POST-OPERATIVE DIAGNOSIS:  RIGHT UPPER LOBE ADENOCARCINOMA  PROCEDURE:  Procedure(s): RIGHT VIDEO ASSISTED THORACOSCOPY RIGHT UPPER LOBECTOMY LUNG &  RIGHT MIDDLE LOBECTOMY LUNG,& NODE DISSECTION   SURGEON:  Surgeon(s): Melrose Nakayama, MD  PHYSICIAN ASSISTANT: WAYNE GOLD PA-C  ANESTHESIA:   general  SPECIMEN:  Source of Specimen:  rul/rml and multiple ln samples  DISPOSITION OF SPECIMEN:  Pathology  DRAINS: 2  Chest Tube(s) in the right hemithorax   PATIENT CONDITION:  PACU - hemodynamically stable.  PRE-OPERATIVE WEIGHT: 81kg  FROZEN : BRONCHIAL MARGIN FREE OF TUMOR, but tumor present in peribronchial node, additional nodal tissue removed.   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  BLOOD LOSS: 1500ML, 2 UNITS OF PRBC'S TRANSFUSED

## 2013-10-18 NOTE — Transfer of Care (Signed)
Immediate Anesthesia Transfer of Care Note  Patient: Henry Hudson  Procedure(s) Performed: Procedure(s): RIGHT VIDEO ASSISTED THORACOSCOPY RIGHT UPPER LOBECTOMY LUNG &  RIGHT MIDDLE LOBECTOMY LUNG,& NODE DISSECTION  (Right)  Patient Location: PACU  Anesthesia Type:General  Level of Consciousness: responds to stimulation  Airway & Oxygen Therapy: Patient Spontanous Breathing and Patient connected to face mask oxygen  Post-op Assessment: Report given to PACU RN and Post -op Vital signs reviewed and stable  Post vital signs: Reviewed and stable  Complications: No apparent anesthesia complications

## 2013-10-19 ENCOUNTER — Inpatient Hospital Stay (HOSPITAL_COMMUNITY): Payer: Medicare Other

## 2013-10-19 LAB — POCT I-STAT 3, ART BLOOD GAS (G3+)
ACID-BASE DEFICIT: 2 mmol/L (ref 0.0–2.0)
Bicarbonate: 21.6 mEq/L (ref 20.0–24.0)
O2 SAT: 91 %
PO2 ART: 57 mmHg — AB (ref 80.0–100.0)
Patient temperature: 98
TCO2: 23 mmol/L (ref 0–100)
pCO2 arterial: 32.5 mmHg — ABNORMAL LOW (ref 35.0–45.0)
pH, Arterial: 7.43 (ref 7.350–7.450)

## 2013-10-19 LAB — BASIC METABOLIC PANEL
BUN: 18 mg/dL (ref 6–23)
CO2: 21 mEq/L (ref 19–32)
CREATININE: 1.05 mg/dL (ref 0.50–1.35)
Calcium: 8.2 mg/dL — ABNORMAL LOW (ref 8.4–10.5)
Chloride: 106 mEq/L (ref 96–112)
GFR calc Af Amer: 74 mL/min — ABNORMAL LOW (ref >90)
GFR, EST NON AFRICAN AMERICAN: 64 mL/min — AB (ref >90)
GLUCOSE: 171 mg/dL — AB (ref 70–99)
Potassium: 4.1 mEq/L (ref 3.7–5.3)
SODIUM: 138 meq/L (ref 137–147)

## 2013-10-19 LAB — CBC
HCT: 31.2 % — ABNORMAL LOW (ref 39.0–52.0)
Hemoglobin: 10.7 g/dL — ABNORMAL LOW (ref 13.0–17.0)
MCH: 30.1 pg (ref 26.0–34.0)
MCHC: 34.3 g/dL (ref 30.0–36.0)
MCV: 87.9 fL (ref 78.0–100.0)
Platelets: 178 10*3/uL (ref 150–400)
RBC: 3.55 MIL/uL — ABNORMAL LOW (ref 4.22–5.81)
RDW: 14 % (ref 11.5–15.5)
WBC: 16 10*3/uL — ABNORMAL HIGH (ref 4.0–10.5)

## 2013-10-19 MED ORDER — ENOXAPARIN SODIUM 40 MG/0.4ML ~~LOC~~ SOLN
40.0000 mg | SUBCUTANEOUS | Status: DC
  Administered 2013-10-19 – 2013-10-22 (×4): 40 mg via SUBCUTANEOUS
  Filled 2013-10-19 (×5): qty 0.4

## 2013-10-19 MED ORDER — ALBUTEROL SULFATE (2.5 MG/3ML) 0.083% IN NEBU
2.5000 mg | INHALATION_SOLUTION | Freq: Four times a day (QID) | RESPIRATORY_TRACT | Status: DC | PRN
  Administered 2013-10-21 (×2): 2.5 mg via RESPIRATORY_TRACT
  Filled 2013-10-19 (×2): qty 3

## 2013-10-19 MED ORDER — LISINOPRIL 20 MG PO TABS
20.0000 mg | ORAL_TABLET | Freq: Every day | ORAL | Status: DC
  Administered 2013-10-19: 20 mg via ORAL
  Filled 2013-10-19 (×2): qty 1

## 2013-10-19 NOTE — Progress Notes (Addendum)
      [address]       [place] [address]             [phone]      1 Day Post-Op Procedure(s) (LRB): RIGHT VIDEO ASSISTED THORACOSCOPY RIGHT UPPER LOBECTOMY LUNG &  RIGHT MIDDLE LOBECTOMY LUNG,& NODE DISSECTION  (Right)  Subjective:  [name] states he is doing okay.  His pain is controlled for the most part, however he does have more pain if he moves or coughs.  He also occasionally has the hiccups.  Objective: Vital signs in last 24 hours: Temp:  [95.6 F (35.3 C)-98.3 F (36.8 C)] 98.3 F (36.8 C) (05/07 0716) Pulse Rate:  [65-116] 90 (05/07 0800) Cardiac Rhythm:  [-] Normal sinus rhythm (05/07 0800) Resp:  [15-25] 17 (05/07 0800) BP: (107-171)/(55-87) 149/64 mmHg (05/07 0800) SpO2:  [92 %-100 %] 100 % (05/07 0800) Arterial Line BP: (109-182)/(49-77) 182/61 mmHg (05/07 0800) Weight:  [181 lb 7 oz (82.3 kg)] 181 lb 7 oz (82.3 kg) (05/07 0500)  Intake/Output from previous day: 05/06 0701 - 05/07 0700 In: 6468 [P.O.:180; I.V.:2930; Blood:670; IV Piggyback:450] Out: 2870 [Urine:950; Blood:1500; Chest Tube:420] Intake/Output this shift: Total I/O In: 125 [I.V.:125] Out: 95 [Urine:45; Chest Tube:50]  General appearance: alert, cooperative and no distress Heart: regular rate and rhythm Lungs: clear to auscultation bilaterally Abdomen: soft, non-tender; bowel sounds normal; no masses,  no organomegaly Wound: clean and dry  Lab Results:  Recent Labs  10/16/13 1518 10/19/13 0429  WBC 10.8* 16.0*  HGB 12.8* 10.7*  HCT 37.2* 31.2*  PLT 270 178   BMET:  Recent Labs  10/16/13 1518 10/19/13 0400  NA 142 138  K 4.6 4.1  CL 104 106  CO2 26 21  GLUCOSE 98 171*  BUN 17 18  CREATININE 1.11 1.05  CALCIUM 9.2 8.2*    PT/INR:  Recent Labs  10/16/13 1518  LABPROT 12.9  INR 0.99   ABG    Component Value Date/Time   PHART 7.430 10/19/2013 0352   HCO3 21.6 10/19/2013 0352   TCO2 23 10/19/2013 0352   ACIDBASEDEF 2.0 10/19/2013 0352   O2SAT  91.0 10/19/2013 0352   CBG (last 3)  No results found for this basename: GLUCAP,  in the last 72 hours  Assessment/Plan: S/P Procedure(s) (LRB): RIGHT VIDEO ASSISTED THORACOSCOPY RIGHT UPPER LOBECTOMY LUNG &  RIGHT MIDDLE LOBECTOMY LUNG,& NODE DISSECTION  (Right)  1. Chest tube- 470 cc output, no definite air leak, appears to be tidaling with cough, CXR with small right apical pneumothorax 2. Pulm- no acute issues, not on oxygen, continue IS 3. CV- HTN, on home Norvasc and Coreg, will restart home Lisinopril  4. D/C Arterial Line 5. Decrease IV Fluids 6. Dispo- patient stable, patient's chest tube with some tidaling, no definite air leak appreciated, will leave on suction with pneumothorax on CXR, transfer to 3300   LOS: 1 day    Erin Barrett 10/19/2013   Looks good Hoarse after albuterol treatment No air leak- CT to water seal 3300 when bed available

## 2013-10-19 NOTE — Care Management Note (Signed)
    Page 1 of 1   10/19/2013     10:12:00 AM CARE MANAGEMENT NOTE 10/19/2013  Patient:  Henry Hudson, Henry Hudson   Account Number:  [id number]  Date Initiated:  10/19/2013  Documentation initiated by:  [facility]  Subjective/Objective Assessment:   Post op lobectomy     Action/Plan:   Anticipated DC Date:  10/24/2013   Anticipated DC Plan:  [place]  CM consult      Choice offered to / List presented to:             Status of service:  In process, will continue to follow Medicare Important Message given?   (If response is "NO", the following Medicare IM given date fields will be blank) Date Medicare IM given:   Date Additional Medicare IM given:    Discharge Disposition:    Per UR Regulation:  Reviewed for med. necessity/level of care/duration of stay  If discussed at [place] of Stay Meetings, dates discussed:    Comments:  ContactAlfredo, Hudson Spouse [phone]                 Henry, Hudson [phone]  10-19-13 [place], [place] - [phone] Talked with patient and daughter, Henry Hudson  at bedside. Prior to admission lived at home with wife.  Independent . Wife now has broken her wrist and daughter is here to help out with both - step mother and patient.  Goal is to return home - does have a walker with wheels if needed on dc.  CM will continue to follow.

## 2013-10-19 NOTE — Op Note (Signed)
Henry Hudson, Henry Hudson             ACCOUNT NO.:  [id number]  MEDICAL RECORD NO.:  [id number]  LOCATION:  2S15C                        FACILITY:  [facility]  PHYSICIAN:  Revonda Standard. Ruta Capece, M.D.DATE OF BIRTH:  24-Mar-1928  DATE OF PROCEDURE:  10/18/2013 DATE OF DISCHARGE:                              OPERATIVE REPORT   PREOPERATIVE DIAGNOSIS:  Adenocarcinoma of right upper lobe, clinical stage IA.  POSTOPERATIVE DIAGNOSIS:  Adenocarcinoma of right upper lobe, clinical stage IIA.  PROCEDURE: 1. Right video-assisted thoracoscopy. 2. Thoracoscopic right upper and middle lobectomy. 3. Mediastinal lymph node dissection. 4. Placement of On-Q local anesthetic catheter.  SURGEON:  Revonda Standard. Roxan Hockey, [place]:  John Giovanni, PA-C.  ANESTHESIA:  General.  FINDINGS:  Mass abutting the minor fissure, grossly appeared to cross the fissure.  Right middle lobe taken en bloc in order to have an adequate margin on the primary tumor.  Aberrant pulmonary arterial anatomy with 4 small branches supplying the anterior apical segments, the most posterior of these branches was in close proximity to the bronchus. It was torn during the dissection resulted in bleeding.  Bronchial margin clear, but no tumor seen in peribronchial node.  CLINICAL NOTE:  Henry Hudson is an 80 year old gentleman, who in March, was found to have a right upper lobe mass on chest x-ray.  A CT scan confirmed a mass in the right upper lobe abutting the minor fissure. The PET-CT showed this lesion was hypermetabolic.  A needle biopsy confirmed that it was adenocarcinoma.  He was referred for consideration for surgery.  The patient was felt to be an acceptable operative candidate albeit with some increased risk due to his advanced age, but from a cardiovascular and pulmonary standpoint was a good candidate for surgery.  The indications risks, benefits, and alternatives were discussed in detail with the patient.  He  understood and accepted the risks and agreed to proceed.  OPERATIVE NOTE:  [name] was brought to the preoperative holding area on Oct 18, 2013. Anesthesia placed a central line and an arterial blood pressure monitoring line.  He was taken to the operating room, anesthetized, and intubated with a double-lumen endotracheal tube.  A Foley catheter was placed.  Intravenous antibiotics were administered. Sequential compression devices were placed on the calves for DVT prophylaxis.  He was placed in the left lateral decubitus position, and the right chest was prepped and draped in usual sterile fashion.  Single lung ventilation of the left lung was initiated and was tolerated well throughout the procedure.  An incision was made in the midaxillary line at approximately the seventh intercostal space.  It was carried through the skin and subcutaneous tissue.  The chest was entered bluntly using a 5-mm port. The thoracoscope was advanced into the chest.  There was good isolation of the right lung.  The site for the working incision was selected at the level of the confluence of the major and minor fissures in approximately the fourth intercostal space anterolaterally.  A 5-cm incision was made.  No rib spreading was performed during the procedure. The lung was inspected.  There was a relatively complete major fissure. The minor fissure was relatively complete at the confluence,  but was incomplete anteriorly.  The mass was clearly palpable in the right upper lobe.  The mass was palpable through the middle lobe and there was concern that the mass crossed the fissure. It was clear that to a get an adequate surgical margin would require taking so much of the middle lobe that the remainder would be essentially useless.  Therefore, the decision made to perform an upper and middle lobectomy.  The inferior ligament was divided and a level 9 lymph node was sent for pathology.  The pleural reflection  was divided medially along the hilum. The pulmonary veins were dissected out.  The superior pulmonary vein was encircled and divided with an endoscopic GIA stapler.  There was relatively dense fibrous tissue overlying the pulmonary arterial branches and this dissection was quite difficult.  Attention was turned back to the fissure.  The major fissure was dissected out.  The pulmonary arterial branches to the lower lobe were identified.  The 2 middle lobe branches were identified as well.  These were encircled and divided with an endoscopic GIA stapler.  Level 11 lymph nodes that were taken during this dissection were sent for permanent pathology.  Again, the major fissure was almost totally complete laterally.  The posterior ascending artery branch to the right upper lobe was identified at this time, but was not divided.  Next, attention was turned back to the anterior and apical branches.  There were 2 large branches that were visible.  The tissue between the azygos and the hilum was relatively foreshortened and while dissecting this out significant bleeding was encountered.  Pressure was applied with the sponge stick for approximately 15 minutes and with removal of the sponge stick, the bleeding had stopped.  At this point, to allow this area to settle, the Attention was turned back to the major fissure.  The right middle lobe bronchus was identified and was encircled and divided with an endoscopic GIA stapler after confirming good ventilation to all segments of the lower lobe with a test inflation.  After dividing the middle lobe bronchus attention was turned back to the pulmonary arteries to the upper lobe.  Two large branches were seen going to the anterior and apical segment.  These were dissected out.  During this dissection, the bleeding from the pulmonary artery recurred.  Pressure was again held.  The artery branches had been dissected out enough to allow passage of a stapler  and the 2 larger branches were divided.  A third branch then was visible, it was felt this was likely the bleeding Source. Pressure was held superiorly, the branch was dissected out and the stapler was able to be placed across it and the vessel divided.  After that, it was clear that the bleeding was coming from a fourth small branch that arose posteriorly and in close proximity to the bronchus. Pressure was again held on this area.  Ultimately, we were able to control the bleeding by placing a clip on the vessel.  At this point, the posterior ascending branch to the upper lobe was divided with an endoscopic vascular stapler.  Nodal tissue adjacent to the bronchus was taken with the specimen, although this was somewhat limited due to concerns for bleeding from the pulmonary artery.  The stapler was placed across the bronchus and closed.  The test inflation showed no aeration of the upper lobe.  There was good aeration of the lower lobe.  The stapler was fired dividing the bronchus.  The specimen was placed  into an endoscopic retrieval bag, and removed through the incision, and sent for frozen section of the bronchial margins.  While awaiting the frozen section, level 4R nodes were dissected out. These nodes were relatively enlarged, but otherwise benign in appearance.  Level 7 node was taken as well.  The frozen section subsequently returned showing no tumor on the bronchus, but there was tumor in a peribronchial node.  This was in close proximity to the arterial branch, additional nodal tissue was taken from this area and that resulted in bleeding from the arterial branch once again, which again ultimately was able to be controlled with clips but no additional nodal tissue was removed, again due to concerns for bleeding.  The wound was copiously irrigated with warm saline.  A test inflation showed no significant air leak and no leak from the bronchial stumps.  An On-Q local anesthetic  catheter was placed through a separate stab wound posteriorly and tunneled into a subpleural location.  The original port incision as well as a second port incision were used for chest tube placement.  A 36-French right-angle was placed posteriorly, and 28- [place] was placed anteriorly.  Final inspection was made for hemostasis. The lung was reinflated.  The incision was closed with #1 Vicryl fascial closure of the subcutaneous tissue, and skin were closed in standard fashion.  All sponge, needle, and instrument counts were correct at end of the procedure.  The patient was taken from the operating room to the postanesthetic care unit in good condition.     Revonda Standard Roxan Hockey, M.D.     SCH/MEDQ  D:  10/18/2013  T:  10/19/2013  Job:  [id number]

## 2013-10-19 NOTE — Progress Notes (Signed)
UR Completed.  Vergie Living [phone] 10/19/2013

## 2013-10-19 NOTE — Progress Notes (Signed)
Responded to referral from palliative team to assist patient with possible revision of Advance Directive.  I explained the process to patient and family  and left form with patient in the  event he decisde later to make any changes.  Provided empathic listening, prayer, guidance and information sharing.  Will follow as needed.  10/19/13 1200  Clinical Encounter Type  Visited With Patient and family together;Health care provider  Visit Type Spiritual support;Post-op;Other (Comment) (assist with Advance Directive)  Referral From Nurse;Palliative care team  Spiritual Encounters  Spiritual Needs Prayer;Emotional  Stress Factors  Patient Stress Factors None identified  Family Stress Factors None identified  Advance Directives (For Healthcare)  Advance Directive Patient has advance directive, copy not in chart  Type of Advance Directive Healthcare Power of [place];Living will  Does patient want anything changed on advanced directive? Yes - Advance Directive Brochure given to the patient (EDs/MAU/Outpatient/Short Stay only)  [place], [place]

## 2013-10-20 ENCOUNTER — Encounter (HOSPITAL_COMMUNITY): Payer: Self-pay | Admitting: Thoracic Surgery (Cardiothoracic Vascular Surgery)

## 2013-10-20 ENCOUNTER — Inpatient Hospital Stay (HOSPITAL_COMMUNITY): Payer: Medicare Other

## 2013-10-20 LAB — COMPREHENSIVE METABOLIC PANEL
ALT: 8 U/L (ref 0–53)
AST: 14 U/L (ref 0–37)
Albumin: 2.5 g/dL — ABNORMAL LOW (ref 3.5–5.2)
Alkaline Phosphatase: 58 U/L (ref 39–117)
BUN: 19 mg/dL (ref 6–23)
CALCIUM: 8 mg/dL — AB (ref 8.4–10.5)
CO2: 23 mEq/L (ref 19–32)
CREATININE: 1.22 mg/dL (ref 0.50–1.35)
Chloride: 107 mEq/L (ref 96–112)
GFR calc non Af Amer: 53 mL/min — ABNORMAL LOW (ref >90)
GFR, EST AFRICAN AMERICAN: 61 mL/min — AB (ref >90)
GLUCOSE: 117 mg/dL — AB (ref 70–99)
Potassium: 3.8 mEq/L (ref 3.7–5.3)
SODIUM: 140 meq/L (ref 137–147)
TOTAL PROTEIN: 5.3 g/dL — AB (ref 6.0–8.3)
Total Bilirubin: 0.4 mg/dL (ref 0.3–1.2)

## 2013-10-20 LAB — CBC
HCT: 26.9 % — ABNORMAL LOW (ref 39.0–52.0)
HEMOGLOBIN: 9.1 g/dL — AB (ref 13.0–17.0)
MCH: 30.2 pg (ref 26.0–34.0)
MCHC: 33.8 g/dL (ref 30.0–36.0)
MCV: 89.4 fL (ref 78.0–100.0)
Platelets: 138 10*3/uL — ABNORMAL LOW (ref 150–400)
RBC: 3.01 MIL/uL — ABNORMAL LOW (ref 4.22–5.81)
RDW: 14.4 % (ref 11.5–15.5)
WBC: 12.8 10*3/uL — ABNORMAL HIGH (ref 4.0–10.5)

## 2013-10-20 MED ORDER — AMLODIPINE BESYLATE 10 MG PO TABS
10.0000 mg | ORAL_TABLET | Freq: Every day | ORAL | Status: DC
  Administered 2013-10-20 – 2013-10-22 (×3): 10 mg via ORAL
  Filled 2013-10-20 (×4): qty 1

## 2013-10-20 NOTE — Progress Notes (Signed)
Patient ID: Henry Hudson, male   DOB: 29-Oct-1927, 80 y.o.   MRN: [id number]  SICU Evening Rounds:  Stable day.  One of chest tubes removed  Ambulating well

## 2013-10-20 NOTE — Progress Notes (Addendum)
      [address]       [place] [address]             [phone]      2 Days Post-Op Procedure(s) (LRB): RIGHT VIDEO ASSISTED THORACOSCOPY RIGHT UPPER LOBECTOMY LUNG &  RIGHT MIDDLE LOBECTOMY LUNG,& NODE DISSECTION  (Right)  Subjective:  Henry Hudson states he is doing okay.  He is coughing up some sputum which is clear.   Objective: Vital signs in last 24 hours: Temp:  [97.6 F (36.4 C)-98.9 F (37.2 C)] 97.9 F (36.6 C) (05/08 0400) Pulse Rate:  [68-99] 84 (05/08 0700) Cardiac Rhythm:  [-] Normal sinus rhythm (05/08 0600) Resp:  [13-24] 21 (05/08 0700) BP: (86-153)/(30-81) 138/78 mmHg (05/08 0700) SpO2:  [89 %-99 %] 91 % (05/08 0700) Arterial Line BP: (157-193)/(59-70) 193/70 mmHg (05/07 1100) Weight:  [185 lb 6.5 oz (84.1 kg)] 185 lb 6.5 oz (84.1 kg) (05/08 0500)  Intake/Output from previous day: 05/07 0701 - 05/08 0700 In: 1748.8 [P.O.:300; I.V.:1448.8] Out: 2405 [Urine:1885; Chest Tube:520]  General appearance: alert, cooperative and no distress Heart: regular rate and rhythm Lungs: coarse bilateral Abdomen: soft, non-tender; bowel sounds normal; no masses,  no organomegaly Wound: clean and dry  Lab Results:  Recent Labs  10/19/13 0429 10/20/13 0430  WBC 16.0* 12.8*  HGB 10.7* 9.1*  HCT 31.2* 26.9*  PLT 178 138*   BMET:  Recent Labs  10/19/13 0400 10/20/13 0430  NA 138 140  K 4.1 3.8  CL 106 107  CO2 21 23  GLUCOSE 171* 117*  BUN 18 19  CREATININE 1.05 1.22  CALCIUM 8.2* 8.0*    PT/INR: No results found for this basename: LABPROT, INR,  in the last 72 hours ABG    Component Value Date/Time   PHART 7.430 10/19/2013 0352   HCO3 21.6 10/19/2013 0352   TCO2 23 10/19/2013 0352   ACIDBASEDEF 2.0 10/19/2013 0352   O2SAT 91.0 10/19/2013 0352   CBG (last 3)  No results found for this basename: GLUCAP,  in the last 72 hours  Assessment/Plan: S/P Procedure(s) (LRB): RIGHT VIDEO ASSISTED THORACOSCOPY RIGHT UPPER LOBECTOMY LUNG &  RIGHT  MIDDLE LOBECTOMY LUNG,& NODE DISSECTION  (Right)  1. Chest tube- + air leak with cough, minimal output- CXR with small right apical pneumothorax which is unchanged, leave chest tubes to water seal 2. Pulm- wean oxygen as tolerated, + atelectasis, continue IS 3. CV- remains hypertensive, continue Coreg, will increase Norvasc to 10 mg daily, will d/c Lisinopril due to rising creatinine 4. Renal- creatinine trending up, will stop ACE-I, repeat BMET in AM 5. D/C Foley 6. Dispo- patient stable, + air leak with cough, will leave chest tubes today, awaiting transfer to 3300   LOS: 2 days    Henry Hudson 10/20/2013   Chart reviewed, patient examined, agree with above. I don't see any air leak this afternoon, just tidaling. The CXR looks good on water seal with questionable trivial right apical  pneumothorax. Will remove anterior chest tube today.

## 2013-10-21 ENCOUNTER — Inpatient Hospital Stay (HOSPITAL_COMMUNITY): Payer: Medicare Other

## 2013-10-21 LAB — BASIC METABOLIC PANEL
BUN: 16 mg/dL (ref 6–23)
CHLORIDE: 109 meq/L (ref 96–112)
CO2: 24 meq/L (ref 19–32)
Calcium: 8 mg/dL — ABNORMAL LOW (ref 8.4–10.5)
Creatinine, Ser: 1.04 mg/dL (ref 0.50–1.35)
GFR calc non Af Amer: 64 mL/min — ABNORMAL LOW (ref >90)
GFR, EST AFRICAN AMERICAN: 75 mL/min — AB (ref >90)
Glucose, Bld: 114 mg/dL — ABNORMAL HIGH (ref 70–99)
POTASSIUM: 3.8 meq/L (ref 3.7–5.3)
SODIUM: 140 meq/L (ref 137–147)

## 2013-10-21 NOTE — Progress Notes (Signed)
Dr Cyndia Bent updated remaining chest tube pulled per order, pt tolerated well. Small amount of SQ air noted post chest tube pull. Breath sounds remain clear and diminished with crackles to right base. Upper airway wheezing at times post exertion. o2 sat remain >90% on room air. Pt denies SOB. CXR ordered per MD. Reather Laurence

## 2013-10-21 NOTE — Progress Notes (Signed)
3 Days Post-Op Procedure(s) (LRB): RIGHT VIDEO ASSISTED THORACOSCOPY RIGHT UPPER LOBECTOMY LUNG &  RIGHT MIDDLE LOBECTOMY LUNG,& NODE DISSECTION  (Right) Subjective:  No complaints  Objective: Vital signs in last 24 hours: Temp:  [97.8 F (36.6 C)-98.8 F (37.1 C)] 98.8 F (37.1 C) (05/09 0800) Pulse Rate:  [71-95] 85 (05/09 0800) Cardiac Rhythm:  [-] Normal sinus rhythm (05/09 0800) Resp:  [13-23] 20 (05/09 0800) BP: (99-169)/(49-84) 152/84 mmHg (05/09 0800) SpO2:  [90 %-96 %] 94 % (05/09 0800) Weight:  [83.6 kg (184 lb 4.9 oz)] 83.6 kg (184 lb 4.9 oz) (05/09 0630)  Hemodynamic parameters for last 24 hours:    Intake/Output from previous day: 05/08 0701 - 05/09 0700 In: 1325 [P.O.:120; I.V.:1205] Out: 2925 [Urine:2375; Chest Tube:550] Intake/Output this shift: Total I/O In: 50 [I.V.:50] Out: 0   General appearance: alert and cooperative Heart: regular rate and rhythm, S1, S2 normal, no murmur, click, rub or gallop Lungs: clear to auscultation bilaterally no air leak from chest tube  Lab Results:  Recent Labs  10/19/13 0429 10/20/13 0430  WBC 16.0* 12.8*  HGB 10.7* 9.1*  HCT 31.2* 26.9*  PLT 178 138*   BMET:  Recent Labs  10/20/13 0430 10/21/13 0400  NA 140 140  K 3.8 3.8  CL 107 109  CO2 23 24  GLUCOSE 117* 114*  BUN 19 16  CREATININE 1.22 1.04  CALCIUM 8.0* 8.0*    PT/INR: No results found for this basename: LABPROT, INR,  in the last 72 hours ABG    Component Value Date/Time   PHART 7.430 10/19/2013 0352   HCO3 21.6 10/19/2013 0352   TCO2 23 10/19/2013 0352   ACIDBASEDEF 2.0 10/19/2013 0352   O2SAT 91.0 10/19/2013 0352   CBG (last 3)  No results found for this basename: GLUCAP,  in the last 72 hours  Assessment/Plan: S/P Procedure(s) (LRB): RIGHT VIDEO ASSISTED THORACOSCOPY RIGHT UPPER LOBECTOMY LUNG &  RIGHT MIDDLE LOBECTOMY LUNG,& NODE DISSECTION  (Right)  He continues to do well. Will remove remaining chest tube today and transfer to 2W.  Continue mobilization and IS. He will probably be ready to go home Monday.   LOS: 3 days    Gaye Pollack 10/21/2013

## 2013-10-21 NOTE — Progress Notes (Signed)
Pt transferred to 2W06. Pt ambulated distance to new room, on portable tele, room air. o2 sat >90% during ambulation. Family present for transfer. Receiving RN, Augusto Garbe. Present on arrival to new room. Pt placed on receiving units tele. Pt belongings sent with patient. Pt tolerated transfer well. Reather Laurence

## 2013-10-21 NOTE — Progress Notes (Signed)
Fentanyl pca syringe d/c per order. 40mL waste in sink. Charmayne Sheer RN witness waste. Reather Laurence

## 2013-10-21 NOTE — Progress Notes (Signed)
Dr Cyndia Bent updated repeat CXR completed, MD reviewed image and report. MD ok to continue with transfer as ordered. Pt still with intermittent wheezing at times, pt denies SOB. Room air with o2 sat > 90%. SQ air unchanged from previous assessment. Henry Hudson

## 2013-10-22 LAB — TYPE AND SCREEN
ABO/RH(D): A POS
Antibody Screen: NEGATIVE
UNIT DIVISION: 0
UNIT DIVISION: 0
Unit division: 0
Unit division: 0

## 2013-10-22 MED ORDER — ASPIRIN EC 81 MG PO TBEC
81.0000 mg | DELAYED_RELEASE_TABLET | Freq: Two times a day (BID) | ORAL | Status: DC
  Administered 2013-10-22: 81 mg via ORAL
  Filled 2013-10-22 (×3): qty 1

## 2013-10-22 NOTE — Progress Notes (Addendum)
       [address]       ,[place] [address]             [phone]          4 Days Post-Op Procedure(s) (LRB): RIGHT VIDEO ASSISTED THORACOSCOPY RIGHT UPPER LOBECTOMY LUNG &  RIGHT MIDDLE LOBECTOMY LUNG,& NODE DISSECTION  (Right)  Subjective: Feels well, breathing stable.  Walking in halls without difficulty.   Objective: Vital signs in last 24 hours: Patient Vitals for the past 24 hrs:  BP Temp Temp src Pulse Resp SpO2  10/22/13 0910 134/62 mmHg - - 81 - -  10/22/13 0736 108/62 mmHg - - 94 - -  10/22/13 0545 133/92 mmHg 98.1 F (36.7 C) Oral 84 18 93 %  10/21/13 2033 149/95 mmHg 98.4 F (36.9 C) Oral 92 18 97 %  10/21/13 1739 133/60 mmHg - - 87 - -  10/21/13 1446 - 97.9 F (36.6 C) Oral 84 20 95 %  10/21/13 1426 - - - - - 96 %  10/21/13 1407 143/62 mmHg - - - - -  10/21/13 1400 - - - 80 21 92 %  10/21/13 1300 129/55 mmHg - - 73 15 95 %  10/21/13 1200 144/86 mmHg 98.2 F (36.8 C) Oral 77 20 96 %   Current Weight  10/21/13 184 lb 4.9 oz (83.6 kg)     Intake/Output from previous day: 05/09 0701 - 05/10 0700 In: 50 [I.V.:50] Out: 2560 [Urine:2500; Chest Tube:60]    PHYSICAL EXAM:  Heart: RRR Lungs: Clear Wound: Clean and dry    Lab Results: CBC: Recent Labs  10/20/13 0430  WBC 12.8*  HGB 9.1*  HCT 26.9*  PLT 138*   BMET:  Recent Labs  10/20/13 0430 10/21/13 0400  NA 140 140  K 3.8 3.8  CL 107 109  CO2 23 24  GLUCOSE 117* 114*  BUN 19 16  CREATININE 1.22 1.04  CALCIUM 8.0* 8.0*    PT/INR: No results found for this basename: LABPROT, INR,  in the last 72 hours  CXR: FINDINGS:  Interval removal of the right chest tube. Small apical and lateral  pneumothorax, with the apex of the lung projecting at the level of  the posterior margin right second rib as before. No effusion. The  right IJ central line has been removed. Heart size upper limits  normal. Subsegmental atelectasis or scarring at the left lung base  stable.  .  No effusion.  Atheromatous aortic arch. Surgical clips project over the right  hilum.  IMPRESSION:  1. Right chest tube removal with stable small apical pneumothorax.    Assessment/Plan: S/P Procedure(s) (LRB): RIGHT VIDEO ASSISTED THORACOSCOPY RIGHT UPPER LOBECTOMY LUNG &  RIGHT MIDDLE LOBECTOMY LUNG,& NODE DISSECTION  (Right) Pulm status stable, off O2. BPs stable. Continue ambulation, pulm toilet. Hopefully home in am if remains stable.   LOS: 4 days    Coolidge Breeze 10/22/2013   Chart reviewed, patient examined, agree with above. He looks good and is anxious to get home. He should be able to go in am.

## 2013-10-22 NOTE — Discharge Summary (Signed)
[address]       [place] [address]             [phone]              Discharge Summary  Name: Henry Hudson DOB: Sep 07, 1927 80 y.o. MRN: [id number]   Admission Date: 10/18/2013 Discharge Date:     Admitting Diagnosis: Right upper lobe lung mass   Discharge Diagnosis:  Adenocarcinoma, right lung - Stage IB (T2,N0)  Past Medical History  Diagnosis Date  . Chest pain, unspecified     2 Vessel Coronary artery Disease  . Intermediate coronary syndrome   . Unspecified hypertensive kidney disease with chronic kidney disease stage I through stage IV, or unspecified   . Other and unspecified hyperlipidemia   . Hypopotassemia   . Other specified cardiac dysrhythmias(427.89)   . Chronic kidney disease, unspecified   . Personal history of tobacco use, presenting hazards to health   . Hypertension   . Coronary artery disease 2009  . Arthritis   . Cancer     lung cancer   . BPH (benign prostatic hypertrophy)     Takes Flomax     Procedures: RIGHT VIDEO ASSISTED THORACOSCOPY  - 10/18/2013  RIGHT UPPER LOBECTOMY LUNG   RIGHT MIDDLE LOBECTOMY  LYMPH NODE DISSECTION    HPI:  The patient is a 80 y.o. male who had atypical chest pain in March. He was evaluated with a cardiac workup including EKG and a nuclear stress test. The pain was determined to be noncardiac in origin. He also had a chest x-ray which showed a right upper lobe mass. This led to a CT scan, which confirmed a 2.3 x 2.1 x 1.8 cm mass in the right upper lobe abutting the minor fissure. There were some additional small nodules along the major fissure, all less than a centimeter. He then had a PET/CT which showed the mass was hypermetabolic with an SUV of 5.4. There was no evidence of regional or distant metastases. The other subcentimeter pulmonary nodules were not hypermetabolic.  A needle biopsy was done on April 9 which showed adenocarcinoma. He was referred to Dr. Everardo All, and it was felt that he would benefit from surgical resection.  The patient was seen in consultation by Dr. Roxan Hockey for thoracic surgical evaluation, and it was agreed that he would require a right VATS for upper and possible middle lobectomy.  All risks, benefits and alternatives of surgery were explained in detail, and the patient agreed to proceed.     Hospital Course:  The patient was admitted to [facility] on 10/18/2013. The patient was taken to the operating room and underwent the above procedure.  Intraoperatively, the mass was noted to abut the minor fissure, necessitating upper and middle lobectomies.  The postoperative course has generally been uneventful.  His chest tubes have been removed in the standard fashion and follow up chest x-rays have been stable with no pneumothorax.  He has been ambulating in the halls without difficulty and is tolerating a regular diet.  He has been weaned from supplemental oxygen.  Incisions are healing well.  Final pathology remains pending, but intraoperative frozen section revealed adenocarcinoma.  We anticipate discharge home in the next 24 hours provided no acute changes occur.      Recent vital signs:  Filed Vitals:   10/22/13 0910  BP: 134/62  Pulse: 81  Temp:   Resp:     Recent laboratory  studies:  CBC: Recent Labs  10/20/13 0430  WBC 12.8*  HGB 9.1*  HCT 26.9*  PLT 138*   BMET:  Recent Labs  10/20/13 0430 10/21/13 0400  NA 140 140  K 3.8 3.8  CL 107 109  CO2 23 24  GLUCOSE 117* 114*  BUN 19 16  CREATININE 1.22 1.04  CALCIUM 8.0* 8.0*    PT/INR: No results found for this basename: LABPROT, INR,  in the last 72 hours   Discharge Medications:     Medication List    STOP taking these medications       amLODipine 5 MG tablet  Commonly known as:  NORVASC     nitroGLYCERIN 0.4 MG SL tablet  Commonly known as:  NITROSTAT      TAKE these medications       aspirin EC 81 MG tablet  Take 81 mg by mouth 2  (two) times daily.     carvedilol 12.5 MG tablet  Commonly known as:  COREG  Take 1 tablet (12.5 mg total) by mouth 2 (two) times daily with a meal.     lisinopril 20 MG tablet  Commonly known as:  PRINIVIL,ZESTRIL  Take 20 mg by mouth every evening.     tamsulosin 0.4 MG Caps capsule  Commonly known as:  FLOMAX  Take 0.4 mg by mouth every evening.     traMADol 50 MG tablet  Commonly known as:  ULTRAM  Take 1-2 tablets (50-100 mg total) by mouth every 6 (six) hours as needed (mild pain).        Discharge Instructions:  The patient is to refrain from driving, heavy lifting or strenuous activity.  May shower daily and clean incisions with soap and water.  May resume regular diet.   Follow Up:       Future Appointments Provider Department Dept Phone   11/20/2013 11:40 AM Arnoldo Lenis, MD [place] [phone]     Follow-up Information   Follow up with Melrose Nakayama, MD. (office will contact you with an appointment)    Specialty:  Cardiothoracic Surgery   Contact information:   [place] [address] [phone]       Follow up with [facility]. (For suture removal - office will call to schedule)       Follow up with Pieter Partridge, MD. (As directed)    Specialty:  Oncology   Contact information:   [address] [place] [address] [place] 10/22/2013, 11:50 AM

## 2013-10-22 NOTE — Progress Notes (Signed)
10/22/2013 1000 Nursing note During medication administration pt. Questioned why he was being given 162mg  ASA BID. Pt. States he takes 81mg  ASA bid at home. [facility] paged and made aware. RN administered only one 81mg  ASA po to patient per patient request. Pharmacy updated on this information as well to update Med Rec in EPIC.  [place]

## 2013-10-22 NOTE — Progress Notes (Signed)
10/22/2013 1:29 PM Nursing note Pt. Ambulated 300 ft with RW, NT and on RA. Pt. o2 saturations checked during walk on room air 93%. Encouraged two more walks this evening.  [place]

## 2013-10-23 MED ORDER — TRAMADOL HCL 50 MG PO TABS: 50.0000 mg | ORAL_TABLET | Freq: Four times a day (QID) | ORAL | Status: DC | PRN

## 2013-10-23 NOTE — Progress Notes (Signed)
DC home with belongings and accompained by his daughter, reviewed and verbalized understanding of dc instructions, dc home with all instructions and precriptions Henry Hudson

## 2013-10-23 NOTE — Progress Notes (Signed)
[address]       [place] [address]             [phone]      5 Days Post-Op  Procedure(s) (LRB): RIGHT VIDEO ASSISTED THORACOSCOPY RIGHT UPPER LOBECTOMY LUNG &  RIGHT MIDDLE LOBECTOMY LUNG,& NODE DISSECTION  (Right) Subjective: Looks and feels well   Objective  Telemetry sinus rhythm, minor tachy at times   Temp:  [98.2 F (36.8 C)-98.6 F (37 C)] 98.5 F (36.9 C) (05/11 0434) Pulse Rate:  [79-94] 85 (05/11 0434) Resp:  [18] 18 (05/11 0434) BP: (108-155)/(57-93) 140/57 mmHg (05/11 0434) SpO2:  [95 %-97 %] 96 % (05/11 0434)   Intake/Output Summary (Last 24 hours) at 10/23/13 0729 Last data filed at 10/22/13 1350  Gross per 24 hour  Intake    240 ml  Output      0 ml  Net    240 ml       General appearance: alert, cooperative and no distress Heart: regular rate and rhythm Lungs: clear to auscultation bilaterally Abdomen: benign Extremities: no edema Wound: incis healing well  Lab Results:  Recent Labs  10/21/13 0400  NA 140  K 3.8  CL 109  CO2 24  GLUCOSE 114*  BUN 16  CREATININE 1.04  CALCIUM 8.0*   No results found for this basename: AST, ALT, ALKPHOS, BILITOT, PROT, ALBUMIN,  in the last 72 hours No results found for this basename: LIPASE, AMYLASE,  in the last 72 hours No results found for this basename: WBC, NEUTROABS, HGB, HCT, MCV, PLT,  in the last 72 hours No results found for this basename: CKTOTAL, CKMB, TROPONINI,  in the last 72 hours No components found with this basename: POCBNP,  No results found for this basename: DDIMER,  in the last 72 hours No results found for this basename: HGBA1C,  in the last 72 hours No results found for this basename: CHOL, HDL, LDLCALC, TRIG, CHOLHDL,  in the last 72 hours No results found for this basename: TSH, T4TOTAL, FREET3, T3FREE, THYROIDAB,  in the last 72 hours No results found for this basename: VITAMINB12, FOLATE, FERRITIN, TIBC, IRON, RETICCTPCT,  in the last 72  hours  Medications: Scheduled . acetaminophen  1,000 mg Oral 4 times per day   Or  . acetaminophen (TYLENOL) oral liquid 160 mg/5 mL  1,000 mg Oral 4 times per day  . amLODipine  10 mg Oral Daily  . aspirin EC  81 mg Oral BID  . bisacodyl  10 mg Oral Daily  . carvedilol  12.5 mg Oral BID WC  . enoxaparin (LOVENOX) injection  40 mg Subcutaneous Q24H  . senna-docusate  1 tablet Oral QHS  . tamsulosin  0.4 mg Oral QPM     Radiology/Studies:  Dg Chest Port 1 View  10/21/2013   CLINICAL DATA:  Chest tube removal  EXAM: PORTABLE CHEST - 1 VIEW  COMPARISON:  Earlier film of the same date  FINDINGS: Interval removal of the right chest tube. Small apical and lateral pneumothorax, with the apex of the lung projecting at the level of the posterior margin right second rib as before. No effusion. The right IJ central line has been removed. Heart size upper limits normal. Subsegmental atelectasis or scarring at the left lung base stable. . No effusion. Atheromatous aortic arch. Surgical clips project over the right hilum.  IMPRESSION: 1. Right chest tube removal with stable small apical pneumothorax.   Electronically Signed  By: Arne Cleveland M.D.   On: 10/21/2013 13:25    INR: Will add last result for INR, ABG once components are confirmed Will add last 4 CBG results once components are confirmed  Assessment/Plan: S/P Procedure(s) (LRB): RIGHT VIDEO ASSISTED THORACOSCOPY RIGHT UPPER LOBECTOMY LUNG &  RIGHT MIDDLE LOBECTOMY LUNG,& NODE DISSECTION  (Right)  1 feels well, stable for discharge     LOS: 5 days    Henry Hudson  5/11/20157:29 AM

## 2013-10-24 LAB — POCT I-STAT 7, (LYTES, BLD GAS, ICA,H+H)
ACID-BASE DEFICIT: 4 mmol/L — AB (ref 0.0–2.0)
Acid-base deficit: 2 mmol/L (ref 0.0–2.0)
BICARBONATE: 22.4 meq/L (ref 20.0–24.0)
Bicarbonate: 22.5 mEq/L (ref 20.0–24.0)
Calcium, Ion: 1.1 mmol/L — ABNORMAL LOW (ref 1.13–1.30)
Calcium, Ion: 1.15 mmol/L (ref 1.13–1.30)
HEMATOCRIT: 27 % — AB (ref 39.0–52.0)
HEMATOCRIT: 26 % — AB (ref 39.0–52.0)
HEMOGLOBIN: 8.8 g/dL — AB (ref 13.0–17.0)
Hemoglobin: 9.2 g/dL — ABNORMAL LOW (ref 13.0–17.0)
O2 SAT: 100 %
O2 Saturation: 99 %
PCO2 ART: 34.8 mmHg — AB (ref 35.0–45.0)
PH ART: 7.41 (ref 7.350–7.450)
PO2 ART: 112 mmHg — AB (ref 80.0–100.0)
Patient temperature: 35.2
Potassium: 3.5 mEq/L — ABNORMAL LOW (ref 3.7–5.3)
Potassium: 4 mEq/L (ref 3.7–5.3)
Sodium: 142 mEq/L (ref 137–147)
Sodium: 140 mEq/L (ref 137–147)
TCO2: 24 mmol/L (ref 0–100)
TCO2: 24 mmol/L (ref 0–100)
pCO2 arterial: 41.2 mmHg (ref 35.0–45.0)
pH, Arterial: 7.335 — ABNORMAL LOW (ref 7.350–7.450)
pO2, Arterial: 228 mmHg — ABNORMAL HIGH (ref 80.0–100.0)

## 2013-10-26 LAB — PULMONARY FUNCTION TEST
DL/VA % pred: 70 %
DL/VA: 2.91 ml/min/mmHg/L
DLCO COR % PRED: 60 %
DLCO UNC % PRED: 55 %
DLCO cor: 14.64 ml/min/mmHg
DLCO unc: 13.55 ml/min/mmHg
FEF 25-75 Pre: 1.75 L/sec
FEF2575-%Pred-Pre: 138 %
FEV1-%Pred-Pre: 105 %
FEV1-Pre: 2.11 L
FEV1FVC-%Pred-Pre: 104 %
FEV6-%PRED-PRE: 105 %
FEV6-Pre: 2.81 L
FEV6FVC-%PRED-PRE: 109 %
FVC-%Pred-Pre: 97 %
FVC-Pre: 2.83 L
PRE FEV1/FVC RATIO: 74 %
PRE FEV6/FVC RATIO: 99 %
RV % PRED: 121 %
RV: 2.92 L
TLC % pred: 99 %
TLC: 5.84 L

## 2013-10-30 ENCOUNTER — Ambulatory Visit (INDEPENDENT_AMBULATORY_CARE_PROVIDER_SITE_OTHER): Payer: Self-pay

## 2013-10-30 DIAGNOSIS — D381 Neoplasm of uncertain behavior of trachea, bronchus and lung: Secondary | ICD-10-CM

## 2013-10-30 DIAGNOSIS — Z4802 Encounter for removal of sutures: Secondary | ICD-10-CM

## 2013-10-30 NOTE — Progress Notes (Signed)
Removed 2 sutures form chest tube sites. One of the incision sites have a slight separation at the edges, no tunneling seen. No drainage, redness or signs of infection. Patient tolerated well.

## 2013-11-13 ENCOUNTER — Other Ambulatory Visit: Payer: Self-pay | Admitting: Thoracic Surgery (Cardiothoracic Vascular Surgery)

## 2013-11-13 DIAGNOSIS — C3490 Malignant neoplasm of unspecified part of unspecified bronchus or lung: Secondary | ICD-10-CM

## 2013-11-14 ENCOUNTER — Ambulatory Visit
Admission: RE | Admit: 2013-11-14 | Discharge: 2013-11-14 | Disposition: A | Discharge status: Home / Self Care | Payer: Medicare Other | Source: Ambulatory Visit | Attending: Thoracic Surgery (Cardiothoracic Vascular Surgery) | Admitting: Thoracic Surgery (Cardiothoracic Vascular Surgery)

## 2013-11-14 ENCOUNTER — Ambulatory Visit (INDEPENDENT_AMBULATORY_CARE_PROVIDER_SITE_OTHER): Payer: Self-pay | Admitting: Thoracic Surgery (Cardiothoracic Vascular Surgery)

## 2013-11-14 ENCOUNTER — Encounter: Payer: Self-pay | Admitting: Thoracic Surgery (Cardiothoracic Vascular Surgery)

## 2013-11-14 VITALS — BP 140/79 | HR 81 | Resp 16 | Ht 68.0 in | Wt 183.0 lb

## 2013-11-14 DIAGNOSIS — Z9889 Other specified postprocedural states: Secondary | ICD-10-CM

## 2013-11-14 DIAGNOSIS — C3410 Malignant neoplasm of upper lobe, unspecified bronchus or lung: Secondary | ICD-10-CM

## 2013-11-14 DIAGNOSIS — Z902 Acquired absence of lung [part of]: Secondary | ICD-10-CM

## 2013-11-14 DIAGNOSIS — C3490 Malignant neoplasm of unspecified part of unspecified bronchus or lung: Secondary | ICD-10-CM

## 2013-11-14 NOTE — Progress Notes (Signed)
HPI:  [name] is today for a scheduled postoperative followup visit.  He is a 80 year old gentleman who had a right upper middle lobectomy on 10/18/2013 for a non-small cell carcinoma. This turned out to be abutting but not actually crossing the minor fissure. It was a T2 N0, stage IB lesion. There was a note in the pathology report report of atypical cells in peribronchial tissue near the margin that was suspicious for metastatic cancer, but the lymph node from that area was negative, as were all the other nodes.  He had an uncomplicated postoperative course and was discharged on postoperative day #4. He now returns for a scheduled visit. He says they've been feeling well. He does have some discomfort from his incision, but has not taken any pain pills since leaving the hospital. In fact he never he filled the prescription. He has not noted any issues with shortness of breath.  Past Medical History  Diagnosis Date  . Chest pain, unspecified     2 Vessel Coronary artery Disease  . Intermediate coronary syndrome   . Unspecified hypertensive kidney disease with chronic kidney disease stage I through stage IV, or unspecified   . Other and unspecified hyperlipidemia   . Hypopotassemia   . Other specified cardiac dysrhythmias(427.89)   . Chronic kidney disease, unspecified   . Personal history of tobacco use, presenting hazards to health   . Hypertension   . Coronary artery disease 2009  . Arthritis   . Cancer     lung cancer   . BPH (benign prostatic hypertrophy)     Takes Flomax      Current Outpatient Prescriptions  Medication Sig Dispense Refill  . aspirin EC 81 MG tablet Take 81 mg by mouth 2 (two) times daily.      . carvedilol (COREG) 12.5 MG tablet Take 1 tablet (12.5 mg total) by mouth 2 (two) times daily with a meal.  180 tablet  3  . lisinopril (PRINIVIL,ZESTRIL) 20 MG tablet Take 20 mg by mouth every evening.      . tamsulosin (FLOMAX) 0.4 MG CAPS capsule Take 0.4 mg by  mouth every evening.       Marland Kitchen amLODipine (NORVASC) 5 MG tablet        No current facility-administered medications for this visit.    Physical Exam BP 140/79  Pulse 81  Resp 16  Ht 5\' 8"  (1.727 m)  Wt 183 lb (83.008 kg)  BMI 27.83 kg/m2  SpO50 31% 80 year old male in no acute distress Well-developed and well-nourished Alert oriented x3 with no focal deficits Cardiac regular rate and rhythm Lungs clear, slightly diminished in right base Incisions healing well  Diagnostic Tests: Chest x-ray 11/14/2013 t[place]las postoperative changes.  Impression: 80 year old gentleman who is now about a month out from a right upper middle lobectomy for a stage IB non-small cell carcinoma. He is doing well at this point in time. I am quite frankly amazing at just how well he is doing. \ All of his nodes were negative and he was stage IB based on the visceral pleural involvement. The only compounding factor with his pathology was the finding of questionable disease in the. Bronchial tissue on the frozen. On the bronchus itself the margin was negative and the nodes from around the bronchus were negative, so I have a hard time putting those findings together. My bias would be just to observe him, but he will see Dr. Tressie Hudson in a couple of weeks and can talk about it  with him as well.  Plan: Follow up with Dr. Tressie Hudson at scheduled appointment.  I will see him back in 2 months with a chest x-ray

## 2013-11-20 ENCOUNTER — Ambulatory Visit (INDEPENDENT_AMBULATORY_CARE_PROVIDER_SITE_OTHER): Payer: Medicare Other | Admitting: Cardiology

## 2013-11-20 ENCOUNTER — Encounter: Payer: Self-pay | Admitting: Cardiology

## 2013-11-20 VITALS — BP 163/86 | HR 76 | Ht 68.0 in | Wt 177.0 lb

## 2013-11-20 DIAGNOSIS — I2510 Atherosclerotic heart disease of native coronary artery without angina pectoris: Secondary | ICD-10-CM

## 2013-11-20 DIAGNOSIS — I10 Essential (primary) hypertension: Secondary | ICD-10-CM

## 2013-11-20 NOTE — Patient Instructions (Signed)
   Your physician has requested that you regularly monitor and record your blood pressure readings at home. Please take at varied times of the day over the next several weeks & return to PMD or [place]  Office will contact with results via phone or letter.   Your physician wants you to follow up in:  1 year.  You will receive a reminder letter in the mail one-two months in advance.  If you don't receive a letter, please call our office to schedule the follow up appointment

## 2013-11-20 NOTE — Progress Notes (Signed)
Clinical Summary [name] is a 80 y.o.male last seen by NP Purcell Nails, this is our first visit together. He is seen for the following medical problems.  1. CAD - patient with prior stenting in 2009, DES to distal LCX and DES to prox RCA.   - admission 08/2013 with atypical chest pain, MPI at that time showed no ischemia. - denies any recent chest pain. Has had some SOB after lobectomy.    2. Lung mass - incidental finding on recent chest CT - he is s/p VATs. Found to be adenocarcinoma of the right upper lobe, stage IA. Had upper and middle lobectomy.  - continues to follow with CT surgery  3. Hyperlipidemia - not tolerant of statins due to joint pains, followed by Dr Nadara Mustard  4. HTN - checks at home regularly, typically around 130s/80s   Past Medical History  Diagnosis Date  . Chest pain, unspecified     2 Vessel Coronary artery Disease  . Intermediate coronary syndrome   . Unspecified hypertensive kidney disease with chronic kidney disease stage I through stage IV, or unspecified   . Other and unspecified hyperlipidemia   . Hypopotassemia   . Other specified cardiac dysrhythmias(427.89)   . Chronic kidney disease, unspecified   . Personal history of tobacco use, presenting hazards to health   . Hypertension   . Coronary artery disease 2009  . Arthritis   . Cancer     lung cancer   . BPH (benign prostatic hypertrophy)     Takes Flomax     Allergies  Allergen Reactions  . Bee Venom Swelling  . Penicillins     REACTION: [place]ushing  . Statins Other (See Comments)    Gout     Current Outpatient Prescriptions  Medication Sig Dispense Refill  . amLODipine (NORVASC) 5 MG tablet       . aspirin EC 81 MG tablet Take 81 mg by mouth 2 (two) times daily.      . carvedilol (COREG) 12.5 MG tablet Take 1 tablet (12.5 mg total) by mouth 2 (two) times daily with a meal.  180 tablet  3  . lisinopril (PRINIVIL,ZESTRIL) 20 MG tablet Take 20 mg by mouth every evening.      .  tamsulosin (FLOMAX) 0.4 MG CAPS capsule Take 0.4 mg by mouth every evening.        No current facility-administered medications for this visit.     Past Surgical History  Procedure Laterality Date  . Remote lumbar laminectomy    . Left knee replacement      [facility]  . Stent placement 04/2008 x 2    . Coronary angioplasty  2009    2 stents  . Eye surgery Bilateral     cataract surgery  . Vasectomy    . Video assisted thoracoscopy (vats)/ lobectomy Right 10/18/2013    Procedure: RIGHT VIDEO ASSISTED THORACOSCOPY RIGHT UPPER LOBECTOMY LUNG &  RIGHT MIDDLE LOBECTOMY LUNG,& NODE DISSECTION ;  Surgeon: Melrose Nakayama, MD;  Location: [facility];  Service: Thoracic;  Laterality: Right;     Allergies  Allergen Reactions  . Bee Venom Swelling  . Penicillins     REACTION: Flushing  . Statins Other (See Comments)    Gout      Family History  Problem Relation Age of Onset  . Heart failure Mother 84  . COPD Mother   . Heart failure Father 58  . Cancer Brother      Social History  Mr. Cho reports that he quit smoking about 20 years ago. His smoking use included Cigarettes. He has a 50 pack-year smoking history. He has never used smokeless tobacco. Mr. Morales reports that he does not drink alcohol.   Review of Systems CONSTITUTIONAL: No weight loss, fever, chills, weakness or fatigue.  HEENT: Eyes: No visual loss, blurred vision, double vision or yellow sclerae.No hearing loss, sneezing, congestion, runny nose or sore throat.  SKIN: No rash or itching.  CARDIOVASCULAR: per HPI RESPIRATORY: No shortness of breath, cough or sputum.  GASTROINTESTINAL: No anorexia, nausea, vomiting or diarrhea. No abdominal pain or blood.  GENITOURINARY: No burning on urination, no polyuria NEUROLOGICAL: No headache, dizziness, syncope, paralysis, ataxia, numbness or tingling in the extremities. No change in bowel or bladder control.  MUSCULOSKELETAL: No muscle, back pain, joint pain or stiffness.    LYMPHATICS: No enlarged nodes. No history of splenectomy.  PSYCHIATRIC: No history of depression or anxiety.  ENDOCRINOLOGIC: No reports of sweating, cold or heat intolerance. No polyuria or polydipsia.  Marland Kitchen   Physical Examination p 76 bp 155/70 Wt 177 lbs BMI 27 Gen: resting comfortably, no acute distress HEENT: no scleral icterus, pupils equal round and reactive, no palptable cervical adenopathy,  CV: RRR, no m/r/g, no JVD Resp: Clear to auscultation bilaterally GI: abdomen is soft, non-tender, non-distended, normal bowel sounds, no hepatosplenomegaly MSK: extremities are warm, no edema.  Skin: warm, no rash Neuro:  no focal deficits Psych: appropriate affect   Diagnostic Studies 04/2008 Cath  RESULTS: Hemodynamics: RA mean 1; RV 12/2; PA 70, #1 with a mean of 7;  pulmonary capillary pressure mean 4; LV 120/7; and AO 120/60.  Coronaries: Left main had 25% stenosis. The LAD had proximal 25%  followed by tandem mid 25% lesion. The LAD wrapped the apex. There was  small first diagonal, which was free of high-grade disease. A moderate-  sized second diagonal had a long ostial 30% stenosis. The circumflex in  the AV groove had diffuse luminal irregularities. The distal vessel  before a large second posterolateral had a focal 99% stenosis. First  obtuse marginal was large with ostial 25% stenosis. Second obtuse  marginal was moderate-sized with ostial 40% stenosis. First  posterolateral was moderate size and normal. Second posterolateral as  described, was compromised by the 99% lesion in the AV groove medially  proximal to the takeoff of this vessel. The vessel was large without  high-grade stenosis. Right coronary artery is a dominant vessel. There  was a long proximal 50% stenosis, terminating as a long 80% lesion  before the RV Hancel Ion. There was a PDA, which was large and normal.  Left ventriculogram: A left ventricle was not injected secondary to  some mild renal insufficiency.   CONCLUSION: Severe circumflex and right coronary artery stenosis.  Normal right heart and pulmonary pressures. It certainly seems that his  dyspnea is an anginal equivalent. He does have chest discomfort.  PLAN: Based on the above. The patient who had percutaneous  revascularization of the right coronary artery and circumflex by Dr.  Burt Knack. He will have aggressive secondary risk reduction.  ASSESSMENT: Successful two-vessel percutaneous coronary intervention  using single drug-eluting stents in the distal left circumflex and  proximal to mid right coronary artery.  RECOMMENDATIONS: I recommend a minimum of 12 months' dual antiplatelet  therapy with aspirin and Plavix.  08/2013 Stress MPI FINDINGS Pharmacological stress  Baseline EKG showed normal sinus rhythm. After injection heart rate increased from 66 beats per min up  to 109 beats per min and blood pressure increased from 155/78 up to 156/71. The test was stopped after injection was complete, the patient did not experience any chest pain. Post-injection EKG showed no ischemic changes and no significant arrhythmias  Myocardial perfusion imaging  Raw images showed appropriate radiotracer uptake. There was a moderate-sized inferior wall defect seen in the resting images, this area was less intense in the post-injection images. The inferior wall had normal wall motion, overall findings consistent with sub- diaphragmatic attenuation. There were no other myocardial perfusion defects.  Gated imaging showed end-diastolic volume 72 mL, end systolic volume 23 mL, left ventricular ejection fraction 67%, TID 1.03. There was normal wall motion.  IMPRESSION 1. Negative Lexiscan MPI for ischemia  2. Normal LV systolic function with normal wall motion  3. Low risk study for major cardiac events.     Assessment and Plan  1. CAD - no current symptoms - continue risk factor modification and secondary prevention  2. Lung Cancer -  continue CT surg follow up  3. Hyperlipidemia - followed by pcp, patient reportedly intolerant to statins due to joint pain  4. HTN - elevated in clinic, home numbers at goal - continue currents, patient to keep bp log   F/u 1 year   Arnoldo Lenis, M.D., F.A.C.C.

## 2014-01-16 ENCOUNTER — Ambulatory Visit: Payer: Medicare Other | Admitting: Thoracic Surgery (Cardiothoracic Vascular Surgery)

## 2014-01-18 ENCOUNTER — Other Ambulatory Visit: Payer: Self-pay | Admitting: Thoracic Surgery (Cardiothoracic Vascular Surgery)

## 2014-01-18 DIAGNOSIS — C3490 Malignant neoplasm of unspecified part of unspecified bronchus or lung: Secondary | ICD-10-CM

## 2014-01-23 ENCOUNTER — Ambulatory Visit
Admission: RE | Admit: 2014-01-23 | Discharge: 2014-01-23 | Disposition: A | Discharge status: Home / Self Care | Payer: Medicare Other | Source: Ambulatory Visit | Attending: Thoracic Surgery (Cardiothoracic Vascular Surgery) | Admitting: Thoracic Surgery (Cardiothoracic Vascular Surgery)

## 2014-01-23 ENCOUNTER — Ambulatory Visit (INDEPENDENT_AMBULATORY_CARE_PROVIDER_SITE_OTHER): Payer: Medicare Other | Admitting: Thoracic Surgery (Cardiothoracic Vascular Surgery)

## 2014-01-23 VITALS — BP 157/77 | HR 80 | Resp 18 | Ht 68.0 in | Wt 180.0 lb

## 2014-01-23 DIAGNOSIS — Z9889 Other specified postprocedural states: Secondary | ICD-10-CM

## 2014-01-23 DIAGNOSIS — Z902 Acquired absence of lung [part of]: Secondary | ICD-10-CM

## 2014-01-23 DIAGNOSIS — C3490 Malignant neoplasm of unspecified part of unspecified bronchus or lung: Secondary | ICD-10-CM

## 2014-01-23 DIAGNOSIS — C3410 Malignant neoplasm of upper lobe, unspecified bronchus or lung: Secondary | ICD-10-CM

## 2014-01-23 NOTE — Progress Notes (Signed)
HPI:  [name] turns today for a scheduled 3 month followup visit.  He is an 80 year old man who had right upper and middle lobectomy on 10/18/2013 for turned out to be a T2 N0 non-small cell carcinoma. Postoperatively he did well. He was last seen in the office on June 2 at which time he was doing well.  He says that he still has a cough. It is productive with clear sputum. He has not had any hemoptysis. He is not having any difficulty with shortness of breath, but he does have some right-sided chest pain. He describes this as an ache along his right anterior rib cage like someone is pressing a thumb onto his rib. He can tolerate the pain but he was concerned that could be a sign of recurrence. He says that it doesn't happen every day and is not regular. Sometimes it is associated with activities and at other times not.  Past Medical History  Diagnosis Date  . Chest pain, unspecified     2 Vessel Coronary artery Disease  . Intermediate coronary syndrome   . Unspecified hypertensive kidney disease with chronic kidney disease stage I through stage IV, or unspecified   . Other and unspecified hyperlipidemia   . Hypopotassemia   . Other specified cardiac dysrhythmias(427.89)   . Chronic kidney disease, unspecified   . Personal history of tobacco use, presenting hazards to health   . Hypertension   . Coronary artery disease 2009  . Arthritis   . Cancer     lung cancer   . BPH (benign prostatic hypertrophy)     Takes Flomax    Current Outpatient Prescriptions  Medication Sig Dispense Refill  . amLODipine (NORVASC) 5 MG tablet       . aspirin EC 81 MG tablet Take 81 mg by mouth 2 (two) times daily.      . carvedilol (COREG) 12.5 MG tablet Take 1 tablet (12.5 mg total) by mouth 2 (two) times daily with a meal.  180 tablet  3  . tamsulosin (FLOMAX) 0.4 MG CAPS capsule Take 0.4 mg by mouth every evening.        No current facility-administered medications for this visit.    Physical  Exam BP 157/77  Pulse 80  Resp 18  Ht 5\' 8"  (1.727 m)  Wt 180 lb (81.647 kg)  BMI 27.38 kg/m2  SpO41 65% 80 year old man in no acute distress Well-developed and well-nourished Alert and oriented x3 with no neurologic deficit Cardiac regular rate and rhythm normal S1 and S2 Lungs clear, slightly diminished right base Incisions well healed No cervical or supraclavicular adenopathy  Diagnostic Tests: CHEST 2 VIEW  COMPARISON: 11/14/2013  FINDINGS:  Normal heart size, mediastinal contours and pulmonary vascularity.  Slight tracheal deviation to the RIGHT with mild elevation/ tenting  of the diaphragm compatible with RIGHT hemithorax volume loss as  result of prior RIGHT lobectomy.  No acute infiltrate, pleural effusion or pneumothorax.  RIGHT basilar scarring stable.  LEFT nipple shadow unchanged since earlier exam of 10/16/2013.  No acute infiltrate, pleural effusion or pneumothorax.  Multilevel endplate spur formation thoracic spine.  IMPRESSION:  Prior RIGHT upper lobectomy with stable postsurgical changes of the  RIGHT hemi thorax.  No acute or new intrathoracic abnormalities   Impression: 80 year old man who is now 3 months from right upper middle lobectomy for a T2 N0 non-small cell carcinoma. He has no evidence of recurrent disease.  He had been scheduled for a CT by Dr. Tressie Stalker, but  Mr. [name] canceled it because of concerns about proceeding to much radiation.  I recommended that he return in 3 months for 6 month followup visit. I requested that he get a CT of the chest at that time. He says that he is willing to do so.   Plan: Return in 3 months with CT of chest

## 2014-04-06 ENCOUNTER — Other Ambulatory Visit: Payer: Self-pay | Admitting: *Deleted

## 2014-05-01 ENCOUNTER — Ambulatory Visit (INDEPENDENT_AMBULATORY_CARE_PROVIDER_SITE_OTHER): Payer: Medicare Other | Admitting: Thoracic Surgery (Cardiothoracic Vascular Surgery)

## 2014-05-01 ENCOUNTER — Encounter: Payer: Self-pay | Admitting: Thoracic Surgery (Cardiothoracic Vascular Surgery)

## 2014-05-01 VITALS — BP 111/64 | HR 71 | Resp 16 | Ht 68.0 in | Wt 182.0 lb

## 2014-05-01 DIAGNOSIS — C3491 Malignant neoplasm of unspecified part of right bronchus or lung: Secondary | ICD-10-CM

## 2014-05-01 DIAGNOSIS — Z902 Acquired absence of lung [part of]: Secondary | ICD-10-CM

## 2014-05-01 DIAGNOSIS — R918 Other nonspecific abnormal finding of lung field: Secondary | ICD-10-CM

## 2014-05-01 DIAGNOSIS — Z9889 Other specified postprocedural states: Secondary | ICD-10-CM

## 2014-05-01 NOTE — Progress Notes (Signed)
HPI:  Mr Redmann returns today for a 6 month follow-up visit.   He is an 80 year old man who had right upper and middle lobectomy on 10/18/2013 for turned out to be a T2 N0 non-small cell carcinoma. Postoperatively he did well. He was last seen in the office on June 2 at which time he was doing well.  He says that he recently had a respiratory infection. He was coughing up a large amount of brownish mucus. He is currently nearly finished with a 21 day course of Bactrim. Since that was started his cough is resolved and he feels well. His weight is stable. He has not had any unusual headaches or visual changes. He does not have any residual surgical pain..  Past Medical History  Diagnosis Date  . Chest pain, unspecified     2 Vessel Coronary artery Disease  . Intermediate coronary syndrome   . Unspecified hypertensive kidney disease with chronic kidney disease stage I through stage IV, or unspecified   . Other and unspecified hyperlipidemia   . Hypopotassemia   . Other specified cardiac dysrhythmias(427.89)   . Chronic kidney disease, unspecified   . Personal history of tobacco use, presenting hazards to health   . Hypertension   . Coronary artery disease 2009  . Arthritis   . Cancer     lung cancer   . BPH (benign prostatic hypertrophy)     Takes Flomax       Current Outpatient Prescriptions  Medication Sig Dispense Refill  . amLODipine (NORVASC) 5 MG tablet Take 5 mg by mouth daily.     Marland Kitchen aspirin EC 81 MG tablet Take 81 mg by mouth 2 (two) times daily.    . carvedilol (COREG) 12.5 MG tablet Take 1 tablet (12.5 mg total) by mouth 2 (two) times daily with a meal. 180 tablet 3  . sulfamethoxazole-trimethoprim (BACTRIM DS,SEPTRA DS) 800-160 MG per tablet Take 1 tablet by mouth 2 (two) times daily. X 21 DAYS    . tamsulosin (FLOMAX) 0.4 MG CAPS capsule Take 0.4 mg by mouth every evening.      No current facility-administered medications for this visit.    Physical Exam BP  111/64 mmHg  Pulse 71  Resp 16  Ht 5\' 8"  (1.727 m)  Wt 182 lb (82.555 kg)  BMI 27.68 kg/m2  SpO28 22% 80 year old man in no acute distress Alert and oriented 3 with no neurologic deficits Cardiac regular rate and rhythm normal S1 and S2 Lungs clear with essentially equal breath sounds bilaterally No cervical or supraclavicular adenopathy  Diagnostic Tests: CT from [facility] on 03/23/2014 reviewed Impression 1. Nonspecific cluster of nodular opacities in the subpleural and. Bronchovascular distribution in the medial aspect of the right lower lobe. The largest opacity measures 9 mm. Direct comparison to prior studies difficult given the interval surgical changes of right upper and middle lobectomy and the resultant compensatory hyperinflation of the right lower lobe. I favor these to be infectious/inflammatory in etiology. Recommend follow-up CT scan in 3-6 months given the patient's known history of past proven adenocarcinoma. 2. Stable groundglass attenuation opacity in the superior segment of the left lower lobe dating back to March 2015. 3. Small hiatal hernia. 4. Atherosclerotic vascular disease including multivessel coronary artery disease. 5. Additional ancillary findings as above without significant interval change.   Impression: 80 year old gentleman who is now 6 months out from right upper and middle lobectomy for stage IB non-small cell carcinoma. He is doing well at this point in  time without any evidence of recurrent disease.  His CT in October did show a small cluster of nodules in the right lower lobe. These may well be infectious or inflammatory, however, given his history I do think that a repeat CT in 3 months is indicated to rule out progression. Of course they could've progressed even if infectious or inflammatory. If we do see progression at that time a biopsy would be warranted.  He also has a groundglass opacity in the superior segment left lower lobe. This is  stable. This will need to continue to be followed.  Plan: Return in 3 months CT of chest

## 2014-05-17 ENCOUNTER — Ambulatory Visit (INDEPENDENT_AMBULATORY_CARE_PROVIDER_SITE_OTHER): Payer: Medicare Other | Admitting: Internal Medicine

## 2014-05-17 ENCOUNTER — Encounter: Payer: Self-pay | Admitting: Internal Medicine

## 2014-05-17 ENCOUNTER — Ambulatory Visit (INDEPENDENT_AMBULATORY_CARE_PROVIDER_SITE_OTHER)
Admission: RE | Admit: 2014-05-17 | Discharge: 2014-05-17 | Disposition: A | Discharge status: Home / Self Care | Payer: Medicare Other | Source: Ambulatory Visit | Attending: Internal Medicine | Admitting: Internal Medicine

## 2014-05-17 VITALS — BP 162/80 | HR 70 | Ht 68.0 in | Wt 185.0 lb

## 2014-05-17 DIAGNOSIS — R05 Cough: Secondary | ICD-10-CM

## 2014-05-17 DIAGNOSIS — R059 Cough, unspecified: Secondary | ICD-10-CM

## 2014-05-17 MED ORDER — CIPROFLOXACIN HCL 750 MG PO TABS: 750.0000 mg | ORAL_TABLET | Freq: Two times a day (BID) | ORAL | Status: DC

## 2014-05-17 MED ORDER — FAMOTIDINE 20 MG PO TABS: ORAL_TABLET | ORAL | Status: DC

## 2014-05-17 MED ORDER — PANTOPRAZOLE SODIUM 40 MG PO TBEC: 40.0000 mg | DELAYED_RELEASE_TABLET | Freq: Every day | ORAL | Status: DC

## 2014-05-17 MED ORDER — PREDNISONE 10 MG PO TABS: ORAL_TABLET | ORAL | Status: DC

## 2014-05-17 NOTE — Progress Notes (Signed)
Subjective:    Patient ID: Henry Hudson, male    DOB: 08-18-1927   MRN: [id number]  HPI  83 yowm quit smoking 1995 with incidental finding of RUL nodule > RULobectomy  10/18/13 RIGHT VIDEO ASSISTED THORACOSCOPY RIGHT UPPER LOBECTOMY LUNG & RIGHT MIDDLE LOBECTOMY LUNG,& NODE DISSECTION  Dx adenoca with visceral pleural involvement/ no nodes/ T2 N0 Mx path staging  Referred to pulmonary clinic 05/17/14  by Dr Nadara Mustard  for persistent post   cough    05/17/2014 [facility] office visit/ Henry Hudson   Chief Complaint  Patient presents with  . Pulmonary Consult    Referred by Dr. Roxan Hockey. Pt c/o cough since had VATS surgery 10/18/13. Cough is prod with large amounts of light brown sputum.  He also c/o increased SOB and CP.   unexplained cp > cxr > dx of RUL nodule Cough started immediately p surgery > brown > culture showed multiple species > rx bactrim x 20 days no change> newly started cipro ? date Cough does not typically wake him up but as soon as stir around it starts  Mostly sob coughing Can walk across parking lot/ do [employer] shopping ok   No obvious other patterns in day to day or daytime variabilty or assoc   cp or chest tightness, subjective wheeze overt sinus or hb symptoms. No unusual exp hx or h/o childhood pna/ asthma or knowledge of premature birth.  Sleeping ok without nocturnal  or early am exacerbation  of respiratory  c/o's or need for noct saba. Also denies any obvious fluctuation of symptoms with weather or environmental changes or other aggravating or alleviating factors except as outlined above   Current Medications, Allergies, Complete Past Medical History, Past Surgical History, Family History, and Social History were reviewed in [employer] record.            Review of Systems  Constitutional: Negative for fever, chills, activity change, appetite change and unexpected weight change.  HENT: Negative for congestion, dental problem,  postnasal drip, rhinorrhea, sneezing, sore throat, trouble swallowing and voice change.   Eyes: Negative for visual disturbance.  Respiratory: Positive for cough and shortness of breath. Negative for choking.   Cardiovascular: Positive for chest pain. Negative for leg swelling.  Gastrointestinal: Negative for nausea, vomiting and abdominal pain.  Genitourinary: Negative for difficulty urinating.  Musculoskeletal: Negative for arthralgias.  Skin: Negative for rash.  Psychiatric/Behavioral: Negative for behavioral problems and confusion.       Objective:   Physical Exam  amb wm nad  Wt Readings from Last 3 Encounters:  05/17/14 185 lb (83.915 kg)  05/01/14 182 lb (82.555 kg)  01/23/14 180 lb (81.647 kg)    Vital signs reviewed   HEENT: nl dentition, turbinates, and orophanx. Nl external ear canals without cough reflex   NECK :  without JVD/Nodes/TM/ nl carotid upstrokes bilaterally   LUNGS: no acc muscle use, clear to A and P bilaterally without cough on insp or exp maneuvers   CV:  RRR  no s3 or murmur or increase in P2, no edema   ABD:  soft and nontender with nl excursion in the supine position. No bruits or organomegaly, bowel sounds nl  MS:  warm without deformities, calf tenderness, cyanosis or clubbing  SKIN: warm and dry without lesions    NEURO:  alert, approp, no deficits    CT chest 03/23/14  GG changes sup seg LLL no change since March 2015 muliple tiny non-specific nodules Small  HH   Sputum 04/19/14 pos strep/kleb/serratia all sensitive to cipro   cxr 05/17/14 vs previous studies in system  No active cardiopulmonary disease. Stable prior right lobectomy changes.     Assessment & Plan:

## 2014-05-17 NOTE — Patient Instructions (Addendum)
cipro 750 mg  one twice daily x 10 days total  Prednisone 10 mg take  4 each am x 2 days,   2 each am x 2 days,  1 each am x 2 days and stop   Pantoprazole (protonix) 40 mg   Take 30-60 min before first meal of the day and Pepcid 20 mg one bedtime until return to office - this is the best way to tell whether stomach acid is contributing to your problem.    GERD (REFLUX)  is an extremely common cause of respiratory symptoms just like yours , many times with no obvious heartburn at all.    It can be treated with medication, but also with lifestyle changes including avoidance of late meals, excessive alcohol, smoking cessation, and avoid fatty foods, chocolate, peppermint, colas, red wine, and acidic juices such as orange juice.  NO MINT OR MENTHOL PRODUCTS SO NO COUGH DROPS  USE SUGARLESS CANDY INSTEAD (Jolley ranchers or Stover's or Life Savers) or even ice chips will also do - the key is to swallow to prevent all throat clearing. NO OIL BASED VITAMINS - use powdered substitutes.  Please remember to go to the  x-ray department downstairs for your tests - we will call you with the results when they are available.     Please schedule a follow up office visit in 2 weeks, sooner if needed

## 2014-05-18 NOTE — Progress Notes (Signed)
Quick Note:  Spoke with pt and notified of results per Dr. Wert. Pt verbalized understanding and denied any questions.  ______ 

## 2014-05-20 NOTE — Assessment & Plan Note (Addendum)
The most common causes of chronic cough in immunocompetent adults include the following: upper airway cough syndrome (UACS), previously referred to as postnasal drip syndrome (PNDS), which is caused by variety of rhinosinus conditions; (2) asthma; (3) GERD; (4) chronic bronchitis from cigarette smoking or other inhaled environmental irritants; (5) nonasthmatic eosinophilic bronchitis; and (6) bronchiectasis.   These conditions, singly or in combination, have accounted for up to 94% of the causes of chronic cough in prospective studies.   Other conditions have constituted no >6% of the causes in prospective studies These have included bronchogenic carcinoma, chronic interstitial pneumonia, sarcoidosis, left ventricular failure, ACEI-induced cough, and aspiration from a condition associated with pharyngeal dysfunction.    Chronic cough is often simultaneously caused by more than one condition. A single cause has been found from 38 to 82% of the time, multiple causes from 18 to 62%. Multiply caused cough has been the result of three diseases up to 42% of the time.       No evidence of bronchiectasis on ct so not clear why he still has hosp acquired flora but needs a trial of cipro 750 bid x 10 days and if cough not better needs sinus ct next .  In meantime also needs max rx for upper airway cough syndrome with short course pred and max gerd rx then regroup in 2 weeks  See instructions for specific recommendations which were reviewed directly with the patient who was given a copy with highlighter outlining the key components.

## 2014-05-21 ENCOUNTER — Telehealth: Payer: Self-pay | Admitting: Internal Medicine

## 2014-05-21 NOTE — Telephone Encounter (Signed)
Pt states that Pepcid 20mg  is causing side effects.  Pt states that his chest hurts after taking and he cannot sleep. Pt denies any issues during the daytime.  Pt would like rec's of next steps.  Allergies  Allergen Reactions  . Bee Venom Swelling  . Penicillins     REACTION: Flushing  . Statins Other (See Comments)    Gout   Please advise Dr Melvyn Novas. Thanks.

## 2014-05-21 NOTE — Telephone Encounter (Signed)
Pt aware that the Cipro should be taken bid x 10 days  Please see the below msg regarding c/o's from taking pepcid Thanks

## 2014-05-21 NOTE — Telephone Encounter (Signed)
I doubt that what's doing it but ok to d/c pepcid and take the protonix  bid ac and chlortrimeton at hs 4 mg otc

## 2014-05-21 NOTE — Telephone Encounter (Deleted)
-----   Message from Tanda Rockers, MD sent at 05/20/2014  8:08 AM EST ----- There was a typo on the instructions:  cipro 750 mg   twice daily x 10 days total (I'm not sure what the strength of his pill is so may need more but the total is 750 every 12 hours)

## 2014-05-21 NOTE — Telephone Encounter (Signed)
Called and spoke to pt. Informed pt of recs per MW. Pt verbalized understanding and denied any further questions or concerns at this time.

## 2014-05-31 ENCOUNTER — Ambulatory Visit (INDEPENDENT_AMBULATORY_CARE_PROVIDER_SITE_OTHER): Payer: Medicare Other | Admitting: Internal Medicine

## 2014-05-31 ENCOUNTER — Encounter: Payer: Self-pay | Admitting: Internal Medicine

## 2014-05-31 ENCOUNTER — Ambulatory Visit (INDEPENDENT_AMBULATORY_CARE_PROVIDER_SITE_OTHER)
Admission: RE | Admit: 2014-05-31 | Discharge: 2014-05-31 | Disposition: A | Discharge status: Home / Self Care | Payer: Medicare Other | Source: Ambulatory Visit | Attending: Internal Medicine | Admitting: Internal Medicine

## 2014-05-31 ENCOUNTER — Other Ambulatory Visit (INDEPENDENT_AMBULATORY_CARE_PROVIDER_SITE_OTHER): Payer: Medicare Other

## 2014-05-31 DIAGNOSIS — R05 Cough: Secondary | ICD-10-CM

## 2014-05-31 DIAGNOSIS — R059 Cough, unspecified: Secondary | ICD-10-CM

## 2014-05-31 LAB — CBC WITH DIFFERENTIAL/PLATELET
BASOS PCT: 0.7 % (ref 0.0–3.0)
Basophils Absolute: 0.1 10*3/uL (ref 0.0–0.1)
Eosinophils Absolute: 0.3 10*3/uL (ref 0.0–0.7)
Eosinophils Relative: 2.2 % (ref 0.0–5.0)
HCT: 36.9 % — ABNORMAL LOW (ref 39.0–52.0)
Hemoglobin: 12.1 g/dL — ABNORMAL LOW (ref 13.0–17.0)
LYMPHS PCT: 19.2 % (ref 12.0–46.0)
Lymphs Abs: 2.3 10*3/uL (ref 0.7–4.0)
MCHC: 32.8 g/dL (ref 30.0–36.0)
MCV: 87.5 fl (ref 78.0–100.0)
Monocytes Absolute: 0.8 10*3/uL (ref 0.1–1.0)
Monocytes Relative: 6.5 % (ref 3.0–12.0)
NEUTROS PCT: 71.4 % (ref 43.0–77.0)
Neutro Abs: 8.4 10*3/uL — ABNORMAL HIGH (ref 1.4–7.7)
PLATELETS: 305 10*3/uL (ref 150.0–400.0)
RBC: 4.22 Mil/uL (ref 4.22–5.81)
RDW: 15.1 % (ref 11.5–15.5)
WBC: 11.8 10*3/uL — AB (ref 4.0–10.5)

## 2014-05-31 LAB — SEDIMENTATION RATE: SED RATE: 40 mm/h — AB (ref 0–22)

## 2014-05-31 MED ORDER — PANTOPRAZOLE SODIUM 40 MG PO TBEC: DELAYED_RELEASE_TABLET | ORAL | Status: DC

## 2014-05-31 MED ORDER — PANTOPRAZOLE SODIUM 40 MG PO TBEC
40.0000 mg | DELAYED_RELEASE_TABLET | Freq: Every day | ORAL | Status: DC
Start: 2014-05-31 — End: 2014-05-31

## 2014-05-31 NOTE — Patient Instructions (Addendum)
Change Protonix :  Take 40 mg Take 30- 60 min before your first and last meals of the day   For drainage take chlortrimeton (chlorpheniramine) 4 mg every 4 hours available over the counter (may cause drowsiness)   GERD (REFLUX)  is an extremely common cause of respiratory symptoms just like yours , many times with no obvious heartburn at all.    It can be treated with medication, but also with lifestyle changes including avoidance of late meals, excessive alcohol, smoking cessation, and avoid fatty foods, chocolate, peppermint, colas, red wine, and acidic juices such as orange juice.  NO MINT OR MENTHOL PRODUCTS SO NO COUGH DROPS  USE SUGARLESS CANDY INSTEAD (Jolley ranchers or Stover's or Life Savers) or even ice chips will also do - the key is to swallow to prevent all throat clearing. NO OIL BASED VITAMINS - use powdered substitutes.  Please remember to go to the lab and x-ray department downstairs for your tests - we will call you with the results when they are available.     Please schedule a follow up office visit in 2 weeks, sooner if needed with all active medications in hand

## 2014-05-31 NOTE — Progress Notes (Signed)
Subjective:    Patient ID: Henry Hudson, male    DOB: [birth year]   MRN: [id number]    Brief patient profile:  49 yowm quit smoking 1995 with incidental finding of RUL nodule > RULobectomy  10/18/13 RIGHT VIDEO ASSISTED THORACOSCOPY RIGHT UPPER LOBECTOMY LUNG & RIGHT MIDDLE LOBECTOMY LUNG,& NODE DISSECTION  Dx adenoca with visceral pleural involvement/ no nodes/ T2 N0 Mx path staging  Referred to pulmonary clinic 05/17/14  by Dr Nadara Mustard  for persistent post cough    05/17/2014 [facility] office visit/ Wendee Hata   Chief Complaint  Patient presents with  . Pulmonary Consult    Referred by Dr. Roxan Hockey. Pt c/o cough since had VATS surgery 10/18/13. Cough is prod with large amounts of light brown sputum.  He also c/o increased SOB and CP.   unexplained cp > cxr > dx of RUL nodule Cough started immediately p surgery > brown > culture showed multiple species > rx bactrim x 20 days no change> newly started cipro ? date Cough does not typically wake him up but as soon as stir around it starts  Mostly sob coughing Can walk across parking lot/ do [employer] shopping ok  rec  try cipro 750 mg  one twice daily x 10 days total Prednisone 10 mg take  4 each am x 2 days,   2 each am x 2 days,  1 each am x 2 days and stop  Pantoprazole (protonix) 40 mg   Take 30-60 min before first meal of the day and Pepcid 20 mg one bedtime until return to office - this is the best way to tell whether stomach acid is contributing to your problem.   GERD diet   05/31/2014 f/u ov/Ulysess Witz re: cough p bilobectomy  Chief Complaint  Patient presents with  . Follow-up    Pt states that his cough seems to be worse- producing more sputum- cream colored. SOB and CP are unchanged.   no change in pattern/ severity of cough. No worse p eating, no fever or cp. Did not fully implement gerd recs  No obvious day to day or daytime variabilty or assoc   chest tightness, subjective wheeze overt sinus or hb symptoms. No unusual  exp hx or h/o childhood pna/ asthma or knowledge of premature birth.  Sleeping ok without nocturnal  or early am exacerbation  of respiratory  c/o's or need for noct saba. Also denies any obvious fluctuation of symptoms with weather or environmental changes or other aggravating or alleviating factors except as outlined above   Current Medications, Allergies, Complete Past Medical History, Past Surgical History, Family History, and Social History were reviewed in [employer] record.  ROS  The following are not active complaints unless bolded sore throat, dysphagia, dental problems, itching, sneezing,  nasal congestion or excess/ purulent secretions, ear ache,   fever, chills, sweats, unintended wt loss, pleuritic or exertional cp, hemoptysis,  orthopnea pnd or leg swelling, presyncope, palpitations, heartburn, abdominal pain, anorexia, nausea, vomiting, diarrhea  or change in bowel or urinary habits, change in stools or urine, dysuria,hematuria,  rash, arthralgias, visual complaints, headache, numbness weakness or ataxia or problems with walking or coordination,  change in mood/affect or memory.             .                   Objective:   Physical Exam  amb wm nad  05/31/2014  183  Wt Readings from Last 3 Encounters:  05/17/14 185 lb (83.915 kg)  05/01/14 182 lb (82.555 kg)  01/23/14 180 lb (81.647 kg)    Vital signs reviewed   HEENT: nl dentition, turbinates, and orophanx. Nl external ear canals without cough reflex   NECK :  without JVD/Nodes/TM/ nl carotid upstrokes bilaterally   LUNGS: no acc muscle use, very minimal bilateral insp and exp rhonchi   CV:  RRR  no s3 or murmur or increase in P2, no edema   ABD:  soft and nontender with nl excursion in the supine position. No bruits or organomegaly, bowel sounds nl  MS:  warm without deformities, calf tenderness, cyanosis or clubbing  SKIN: warm and dry without lesions    NEURO:  alert,  approp, no deficits    CT chest 03/23/14  GG changes sup seg LLL no change since March 2015 muliple tiny non-specific nodules Small HH   Sputum 04/19/14 pos strep/kleb/serratia all sensitive to cipro   cxr 05/21/14  Stable postsurgical changes in right hemithorax. No acute findings.  Lab Results  Component Value Date   ESRSEDRATE 40* 05/31/2014               Assessment & Plan:

## 2014-06-01 LAB — ALLERGY FULL PROFILE
Alternaria Alternata: <0.1 kU/L
BAHIA GRASS: 0.51 kU/L — AB
BOX ELDER: 0.53 kU/L — AB
Bermuda Grass: 0.47 kU/L — ABNORMAL HIGH
COMMON RAGWEED: 0.5 kU/L — AB
Candida Albicans: <0.1 kU/L
Cat Dander: 0.21 kU/L — ABNORMAL HIGH
Curvularia lunata: <0.1 kU/L
D. FARINAE: 0.12 kU/L — AB
Dog Dander: 0.32 kU/L — ABNORMAL HIGH
Elm IgE: 0.46 kU/L — ABNORMAL HIGH
Fescue: 0.45 kU/L — ABNORMAL HIGH
G005 RYE, PERENNIAL: 0.46 kU/L — AB
G009 Red Top: 0.45 kU/L — ABNORMAL HIGH
Goldenrod: 0.41 kU/L — ABNORMAL HIGH
HOUSE DUST HOLLISTER: 0.11 kU/L — AB
Helminthosporium halodes: <0.1 kU/L
IGE (IMMUNOGLOBULIN E), SERUM: 26 kU/L (ref <115)
Lamb's Quarters: 0.49 kU/L — ABNORMAL HIGH
OAK CLASS: 0.46 kU/L — AB
PLANTAIN: 0.44 kU/L — AB
SYCAMORE TREE: 0.46 kU/L — AB
TIMOTHY GRASS: 0.49 kU/L — AB

## 2014-06-01 NOTE — Assessment & Plan Note (Signed)
I had an extended discussion with the patient reviewing all relevant studies completed to date and  lasting 15 to 20 minutes of a 25 minute visit on the following ongoing concerns:  The standardized cough guidelines published in Chest by Lissa Morales in 2006 are still the best available and consist of a multiple step process (up to 12!) , not a single office visit,  and are intended  to address this problem logically,  with an alogrithm dependent on response to empiric treatment at  each progressive step  to determine a specific diagnosis with  minimal addtional testing needed. Therefore if adherence is an issue or can't be accurately verified,  it's very unlikely the standard evaluation and treatment will be successful here.    Furthermore, response to therapy (other than acute cough suppression, which should only be used short term with avoidance of narcotic containing cough syrups if possible), can be a gradual process for which the patient may perceive immediate benefit.  Unlike going to an eye doctor where the best perscription is almost always the first one and is immediately effective, this is almost never the case in the management of chronic cough syndromes. Therefore the patient needs to commit up front to consistently adhere to recommendations  for up to 6 weeks of therapy directed at the likely underlying problem(s) before the response can be reasonably evaluated.   Next step is sinus ct and add 1st gen H1 per guidelines   See instructions for specific recommendations which were reviewed directly with the patient who was given a copy with highlighter outlining the key components.

## 2014-06-01 NOTE — Progress Notes (Signed)
Quick Note:  Spoke with pt and notified of results per Dr. Wert. Pt verbalized understanding and denied any questions.  ______ 

## 2014-06-09 ENCOUNTER — Emergency Department (HOSPITAL_COMMUNITY): Payer: Medicare Other

## 2014-06-09 ENCOUNTER — Encounter (HOSPITAL_COMMUNITY): Payer: Self-pay | Admitting: Emergency Medicine

## 2014-06-09 ENCOUNTER — Emergency Department (HOSPITAL_COMMUNITY)
Admission: EM | Admit: 2014-06-09 | Discharge: 2014-06-09 | Disposition: A | Discharge status: Home / Self Care | Payer: Medicare Other | Attending: Emergency Medicine | Admitting: Emergency Medicine

## 2014-06-09 DIAGNOSIS — R0602 Shortness of breath: Secondary | ICD-10-CM

## 2014-06-09 DIAGNOSIS — Z87891 Personal history of nicotine dependence: Secondary | ICD-10-CM | POA: Diagnosis not present

## 2014-06-09 DIAGNOSIS — I2510 Atherosclerotic heart disease of native coronary artery without angina pectoris: Secondary | ICD-10-CM | POA: Diagnosis not present

## 2014-06-09 DIAGNOSIS — I129 Hypertensive chronic kidney disease with stage 1 through stage 4 chronic kidney disease, or unspecified chronic kidney disease: Secondary | ICD-10-CM | POA: Diagnosis not present

## 2014-06-09 DIAGNOSIS — Z85118 Personal history of other malignant neoplasm of bronchus and lung: Secondary | ICD-10-CM | POA: Insufficient documentation

## 2014-06-09 DIAGNOSIS — M1990 Unspecified osteoarthritis, unspecified site: Secondary | ICD-10-CM | POA: Diagnosis not present

## 2014-06-09 DIAGNOSIS — Z79899 Other long term (current) drug therapy: Secondary | ICD-10-CM | POA: Insufficient documentation

## 2014-06-09 DIAGNOSIS — Z9861 Coronary angioplasty status: Secondary | ICD-10-CM | POA: Diagnosis not present

## 2014-06-09 DIAGNOSIS — Z880 Allergy status to penicillin: Secondary | ICD-10-CM | POA: Diagnosis not present

## 2014-06-09 DIAGNOSIS — N184 Chronic kidney disease, stage 4 (severe): Secondary | ICD-10-CM | POA: Diagnosis not present

## 2014-06-09 DIAGNOSIS — Z7982 Long term (current) use of aspirin: Secondary | ICD-10-CM | POA: Diagnosis not present

## 2014-06-09 DIAGNOSIS — R52 Pain, unspecified: Secondary | ICD-10-CM

## 2014-06-09 DIAGNOSIS — R079 Chest pain, unspecified: Secondary | ICD-10-CM | POA: Diagnosis present

## 2014-06-09 DIAGNOSIS — J90 Pleural effusion, not elsewhere classified: Secondary | ICD-10-CM

## 2014-06-09 LAB — CBC WITH DIFFERENTIAL/PLATELET
Basophils Absolute: 0 10*3/uL (ref 0.0–0.1)
Basophils Relative: 1 % (ref 0–1)
EOS PCT: 3 % (ref 0–5)
Eosinophils Absolute: 0.3 10*3/uL (ref 0.0–0.7)
HEMATOCRIT: 34.7 % — AB (ref 39.0–52.0)
HEMOGLOBIN: 11.3 g/dL — AB (ref 13.0–17.0)
LYMPHS ABS: 1.8 10*3/uL (ref 0.7–4.0)
Lymphocytes Relative: 22 % (ref 12–46)
MCH: 28.9 pg (ref 26.0–34.0)
MCHC: 32.6 g/dL (ref 30.0–36.0)
MCV: 88.7 fL (ref 78.0–100.0)
MONOS PCT: 8 % (ref 3–12)
Monocytes Absolute: 0.7 10*3/uL (ref 0.1–1.0)
Neutro Abs: 5.4 10*3/uL (ref 1.7–7.7)
Neutrophils Relative %: 66 % (ref 43–77)
Platelets: 290 10*3/uL (ref 150–400)
RBC: 3.91 MIL/uL — AB (ref 4.22–5.81)
RDW: 13.9 % (ref 11.5–15.5)
WBC: 8.1 10*3/uL (ref 4.0–10.5)

## 2014-06-09 LAB — TROPONIN I: Troponin I: <0.03 ng/mL (ref <0.031)

## 2014-06-09 LAB — COMPREHENSIVE METABOLIC PANEL
ALK PHOS: 80 U/L (ref 39–117)
ALT: 16 U/L (ref 0–53)
ANION GAP: 4 — AB (ref 5–15)
AST: 16 U/L (ref 0–37)
Albumin: 3.5 g/dL (ref 3.5–5.2)
BUN: 19 mg/dL (ref 6–23)
CALCIUM: 8.8 mg/dL (ref 8.4–10.5)
CO2: 26 mmol/L (ref 19–32)
Chloride: 107 mEq/L (ref 96–112)
Creatinine, Ser: 1.17 mg/dL (ref 0.50–1.35)
GFR, EST AFRICAN AMERICAN: 64 mL/min — AB (ref >90)
GFR, EST NON AFRICAN AMERICAN: 55 mL/min — AB (ref >90)
GLUCOSE: 95 mg/dL (ref 70–99)
POTASSIUM: 4.4 mmol/L (ref 3.5–5.1)
Sodium: 137 mmol/L (ref 135–145)
TOTAL PROTEIN: 7.1 g/dL (ref 6.0–8.3)
Total Bilirubin: 0.6 mg/dL (ref 0.3–1.2)

## 2014-06-09 LAB — BRAIN NATRIURETIC PEPTIDE: B Natriuretic Peptide: 47 pg/mL (ref 0.0–100.0)

## 2014-06-09 MED ORDER — LEVOFLOXACIN 500 MG PO TABS
500.0000 mg | ORAL_TABLET | Freq: Once | ORAL | Status: AC
  Administered 2014-06-09: 500 mg via ORAL
  Filled 2014-06-09: qty 1

## 2014-06-09 MED ORDER — IOHEXOL 300 MG/ML  SOLN
80.0000 mL | Freq: Once | INTRAMUSCULAR | Status: AC | PRN
  Administered 2014-06-09: 80 mL via INTRAVENOUS

## 2014-06-09 MED ORDER — LEVOFLOXACIN 500 MG PO TABS: 500.0000 mg | ORAL_TABLET | Freq: Every day | ORAL | Status: DC

## 2014-06-09 MED ORDER — LEVOFLOXACIN 500 MG PO TABS
ORAL_TABLET | ORAL | Status: AC
  Filled 2014-06-09: qty 1

## 2014-06-09 NOTE — ED Notes (Signed)
Pt alert & oriented x4, stable gait. Patient  given discharge instructions, paperwork & prescription(s). Patient verbalized understanding. Pt left department w/ no further questions. 

## 2014-06-09 NOTE — ED Provider Notes (Signed)
CSN: [id number]     Arrival date & time 06/09/14  1423 History  This chart was scribed for Henry Diego, MD by Stephania Fragmin, ED Scribe. This patient was seen in room APA06/APA06 and the patient's care was started at 2:52 PM.    Chief Complaint  Patient presents with  . Chest Pain    Patient is a 80 y.o. male presenting with chest pain. The history is provided by the patient (pt complains of chest pain today). No language interpreter was used.  Chest Pain Pain location:  R chest and L chest Pain quality: aching   Pain radiates to:  Does not radiate Pain radiates to the back: no   Pain severity:  Moderate Duration:  2 days Progression:  Worsening Context: at rest   Associated symptoms: shortness of breath   Associated symptoms: no abdominal pain, no back pain, no cough, no fatigue and no headache     HPI Comments: Henry Hudson is a 80 y.o. male who presents to the Emergency Department complaining of chest pains that kept him awake. He endorses chronic SOB and wheezing. Patient had similar pains in April of this year, where he had a tumor and had a partial lobectomy done in May. [facility] has been treating patient for an infection, per wife. Patient denies chemotherapy.   Past Medical History  Diagnosis Date  . Chest pain, unspecified     2 Vessel Coronary artery Disease  . Intermediate coronary syndrome   . Unspecified hypertensive kidney disease with chronic kidney disease stage I through stage IV, or unspecified   . Other and unspecified hyperlipidemia   . Hypopotassemia   . Other specified cardiac dysrhythmias(427.89)   . Chronic kidney disease, unspecified   . Personal history of tobacco use, presenting hazards to health   . Hypertension   . Coronary artery disease 2009  . Arthritis   . Cancer     lung cancer   . BPH (benign prostatic hypertrophy)     Takes Flomax   Past Surgical History  Procedure Laterality Date  . Remote lumbar laminectomy    . Left knee  replacement      [facility]  . Stent placement 04/2008 x 2    . Coronary angioplasty  2009    2 stents  . Eye surgery Bilateral     cataract surgery  . Vasectomy    . Video assisted thoracoscopy (vats)/ lobectomy Right 10/18/2013    Procedure: RIGHT VIDEO ASSISTED THORACOSCOPY RIGHT UPPER LOBECTOMY LUNG &  RIGHT MIDDLE LOBECTOMY LUNG,& NODE DISSECTION ;  Surgeon: Melrose Nakayama, MD;  Location: [facility];  Service: Thoracic;  Laterality: Right;   Family History  Problem Relation Age of Onset  . Heart failure Mother 102  . COPD Mother   . Heart failure Father 21  . Lung cancer Brother     smoked   History  Substance Use Topics  . Smoking status: Former Smoker -- 1.00 packs/day for 50 years    Types: Cigarettes    Quit date: 06/15/1993  . Smokeless tobacco: Never Used  . Alcohol Use: No    Review of Systems  Constitutional: Negative for appetite change and fatigue.  HENT: Negative for congestion, ear discharge and sinus pressure.   Eyes: Negative for discharge.  Respiratory: Positive for shortness of breath and wheezing. Negative for cough.   Cardiovascular: Positive for chest pain.  Gastrointestinal: Negative for abdominal pain and diarrhea.  Genitourinary: Negative for frequency and hematuria.  Musculoskeletal: Negative for back pain.  Skin: Negative for rash.  Neurological: Negative for seizures and headaches.  Psychiatric/Behavioral: Negative for hallucinations.  All other systems reviewed and are negative.     Allergies  Bee venom; Penicillins; and Statins  Home Medications   Prior to Admission medications   Medication Sig Start Date End Date Taking? Authorizing Provider  amLODipine (NORVASC) 5 MG tablet Take 5 mg by mouth daily.  09/15/13   Historical Provider, MD  aspirin EC 81 MG tablet Take 81 mg by mouth 2 (two) times daily.    Historical Provider, MD  pantoprazole (PROTONIX) 40 MG tablet Take 30- 60 min before your first and last meals of the day 05/31/14    Tanda Rockers, MD  tamsulosin (FLOMAX) 0.4 MG CAPS capsule Take 0.4 mg by mouth every evening.  09/23/13   Historical Provider, MD   BP 164/87 mmHg  Pulse 85  Temp(Src) 98 F (36.7 C) (Oral)  Resp 18  Ht 5\' 6"  (1.676 m)  Wt 180 lb (81.647 kg)  BMI 29.07 kg/m2  SpO2 99% Physical Exam  Constitutional: He is oriented to person, place, and time. He appears well-developed.  HENT:  Head: Normocephalic.  Eyes: Conjunctivae and EOM are normal. No scleral icterus.  Neck: Neck supple. No thyromegaly present.  Cardiovascular: Normal rate and regular rhythm.  Exam reveals no gallop and no friction rub.   No murmur heard. Pulmonary/Chest: No stridor. He has no wheezes. He has no rales. He exhibits no tenderness.  Abdominal: He exhibits no distension. There is no tenderness. There is no rebound.  Musculoskeletal: Normal range of motion. He exhibits no edema.  Lymphadenopathy:    He has no cervical adenopathy.  Neurological: He is oriented to person, place, and time. He exhibits normal muscle tone. Coordination normal.  Skin: No rash noted. No erythema.  Psychiatric: He has a normal mood and affect. His behavior is normal.  Nursing note and vitals reviewed.   ED Course  Procedures (including critical care time)  DIAGNOSTIC STUDIES: Oxygen Saturation is 99% on room air, normal by my interpretation.    COORDINATION OF CARE: 2:55 PM - Discussed treatment plan with pt at bedside which includes tests and pt agreed to plan.   Labs Review Labs Reviewed  CBC WITH DIFFERENTIAL  COMPREHENSIVE METABOLIC PANEL  TROPONIN I  BRAIN NATRIURETIC PEPTIDE    Imaging Review No results found.   EKG Interpretation None     nsr rate 88,  Nl ekg  MDM   Final diagnoses:  Chest pain    Pt with worsening pleural effusion from last week,   Pt with nl vitals, nl studies and non toxic,  Will start levaquin and have pt follow up with pulmonary this week.  The chart was scribed for me under my direct  supervision.  I personally performed the history, physical, and medical decision making and all procedures in the evaluation of this patient.Henry Diego, MD 06/09/14 [phone]

## 2014-06-09 NOTE — ED Notes (Signed)
Pt c/o of left sided chest pains times two days. Patient states he decided to come in today because "I didn't get much sleep last night because of the pain". Pt also c/o of right shoulder pain when sitting straight up. Denies all other symptoms. No other complaints.

## 2014-06-09 NOTE — Discharge Instructions (Signed)
Follow up with your lung md this week.

## 2014-06-09 NOTE — ED Notes (Signed)
Pt reports chest pains and SHOB x last 2 days. Pt reports SHOB worse when he lays flat.

## 2014-06-12 ENCOUNTER — Ambulatory Visit (HOSPITAL_COMMUNITY)
Admission: RE | Admit: 2014-06-12 | Discharge: 2014-06-12 | Disposition: A | Discharge status: Home / Self Care | Payer: Medicare Other | Source: Ambulatory Visit | Attending: Radiology | Admitting: Radiology

## 2014-06-12 ENCOUNTER — Encounter: Payer: Self-pay | Admitting: Internal Medicine

## 2014-06-12 ENCOUNTER — Ambulatory Visit (HOSPITAL_COMMUNITY)
Admission: RE | Admit: 2014-06-12 | Discharge: 2014-06-12 | Disposition: A | Discharge status: Home / Self Care | Payer: Medicare Other | Source: Ambulatory Visit | Attending: Internal Medicine | Admitting: Internal Medicine

## 2014-06-12 ENCOUNTER — Ambulatory Visit (INDEPENDENT_AMBULATORY_CARE_PROVIDER_SITE_OTHER): Payer: Medicare Other | Admitting: Internal Medicine

## 2014-06-12 VITALS — BP 170/88 | HR 81 | Temp 97.6°F | Ht 66.0 in | Wt 181.0 lb

## 2014-06-12 DIAGNOSIS — J9811 Atelectasis: Secondary | ICD-10-CM | POA: Diagnosis not present

## 2014-06-12 DIAGNOSIS — R059 Cough, unspecified: Secondary | ICD-10-CM

## 2014-06-12 DIAGNOSIS — Z85118 Personal history of other malignant neoplasm of bronchus and lung: Secondary | ICD-10-CM | POA: Insufficient documentation

## 2014-06-12 DIAGNOSIS — J90 Pleural effusion, not elsewhere classified: Secondary | ICD-10-CM | POA: Diagnosis not present

## 2014-06-12 DIAGNOSIS — Z9889 Other specified postprocedural states: Secondary | ICD-10-CM

## 2014-06-12 DIAGNOSIS — Z902 Acquired absence of lung [part of]: Secondary | ICD-10-CM | POA: Insufficient documentation

## 2014-06-12 DIAGNOSIS — R05 Cough: Secondary | ICD-10-CM

## 2014-06-12 DIAGNOSIS — J948 Other specified pleural conditions: Secondary | ICD-10-CM

## 2014-06-12 LAB — PROTEIN, BODY FLUID: TOTAL PROTEIN, FLUID: 3.8 g/dL

## 2014-06-12 LAB — GLUCOSE, SEROUS FLUID: Glucose, Fluid: 106 mg/dL

## 2014-06-12 LAB — BODY FLUID CELL COUNT WITH DIFFERENTIAL
Lymphs, Fluid: 97 %
Monocyte-Macrophage-Serous Fluid: 3 % — ABNORMAL LOW (ref 50–90)
Total Nucleated Cell Count, Fluid: 2150 cu mm — ABNORMAL HIGH (ref 0–1000)

## 2014-06-12 LAB — LACTATE DEHYDROGENASE, PLEURAL OR PERITONEAL FLUID: LD FL: 182 U/L — AB (ref 3–23)

## 2014-06-12 MED ORDER — HYDROCODONE-ACETAMINOPHEN 5-325 MG PO TABS: 1.0000 | ORAL_TABLET | ORAL | Status: DC | PRN

## 2014-06-12 MED ORDER — LIDOCAINE HCL (PF) 1 % IJ SOLN
INTRAMUSCULAR | Status: AC
  Filled 2014-06-12: qty 10

## 2014-06-12 NOTE — Assessment & Plan Note (Signed)
-   allergy profile 05/31/2014 >  Ig E 26 multiple low grade allergen POS RAST  Esp trees/ grass  - Sinus CT 07/01/14 >>>  Cough ever since T surgery not consistent with approach to date in term of identifying source and now new problem with effusion but no hydropneumotx so doubt fistula and concerned re ? Recurrent ca or parapneumonic process   For now no change rx but proceed with w/u for effusion and continue max gerd rx / reviewed > return with sinus ct and all meds in hand after the holidays as prev planned

## 2014-06-12 NOTE — Procedures (Signed)
US guided diagnostic/therapeutic right thoracentesis performed yielding 1.4 liters slightly turbid, yellow fluid. The fluid was sent to the lab for preordered studies. F/u CXR pending. No immediate complications.

## 2014-06-12 NOTE — Patient Instructions (Addendum)
Please see patient coordinator before you leave today  to schedule R  thoracentesis as soon as possible  For pain or cough take norco up to one every 4 hours as needed but no tylenol while on it because it has tylenol in it

## 2014-06-12 NOTE — Assessment & Plan Note (Signed)
I had an extended discussion with the patient reviewing all relevant studies completed to date and  lasting 15 to 20 minutes of a 25 minute visit on the following ongoing concerns:   New cp/ r effusion ? Etiology with ddx recurrent ca or parapneumonic process > Discussed in detail all the  indications, usual  risks and alternatives  relative to the benefits with patient who agrees to proceed with thoracentesis asap  See instructions for specific recommendations which were reviewed directly with the patient who was given a copy with highlighter outlining the key components.

## 2014-06-12 NOTE — Progress Notes (Signed)
Subjective:    Patient ID: Henry Hudson, male    DOB: Dec 12, 1927   MRN: [id number]    Brief patient profile:  39 yowm quit smoking 1995 with incidental finding of RUL nodule > RULobectomy  10/18/13 RIGHT VIDEO ASSISTED THORACOSCOPY RIGHT UPPER LOBECTOMY LUNG & RIGHT MIDDLE LOBECTOMY LUNG,& NODE DISSECTION  Dx adenoca with visceral pleural involvement/ no nodes/ T2 N0 Mx path staging  Referred to pulmonary clinic 05/17/14  by Dr Nadara Mustard  for persistent post cough    05/17/2014 1st [facility] office visit/ Wert   Chief Complaint  Patient presents with  . Pulmonary Consult    Referred by Dr. Roxan Hockey. Pt c/o cough since had VATS surgery 10/18/13. Cough is prod with large amounts of light brown sputum.  He also c/o increased SOB and CP.   unexplained cp > cxr > dx of RUL nodule Cough started immediately p surgery > brown > culture showed multiple species > rx bactrim x 20 days no change> newly started cipro ? date Cough does not typically wake him up but as soon as stir around it starts  Mostly sob coughing Can walk across parking lot/ do [employer] shopping ok  rec  try cipro 750 mg  one twice daily x 10 days total Prednisone 10 mg take  4 each am x 2 days,   2 each am x 2 days,  1 each am x 2 days and stop  Pantoprazole (protonix) 40 mg   Take 30-60 min before first meal of the day and Pepcid 20 mg one bedtime until return to office - this is the best way to tell whether stomach acid is contributing to your problem.   GERD diet   05/31/2014 f/u ov/Wert re: cough p bilobectomy  Chief Complaint  Patient presents with  . Follow-up    Pt states that his cough seems to be worse- producing more sputum- cream colored. SOB and CP are unchanged.   no change in pattern/ severity of cough. No worse p eating, no fever or cp. Did not fully implement gerd recs rec Change Protonix :  Take 40 mg Take 30- 60 min before your first and last meals of the day  For drainage take chlortrimeton  (chlorpheniramine) 4 mg every 4 hours available over the counter (may cause drowsiness)  GERD  Please remember to go to the lab and x-ray department downstairs for your tests - we will call you with the results when they are available. Please schedule a follow up office visit in 2 weeks, sooner if needed with all active medications in hand    06/12/2014 f/u ov/Wert re: persistent cough/ new flare of chronic R cp/ new R effusion / did not bring meds  Chief Complaint  Patient presents with  . Follow-up    Pt states his breathing has been progressively worse since last visit here. He was having CP 06/09/14 and had to go to ED. CP worse when he lies down at night and with moderate exertion.   cough some better p last ov but new problem acute onset R posterior pleuritic pain worse than smoldering R cp ever since surgery     > ER dx R effusion  > rx with levaquin and no better. Pain not resp to tylenol, r post chest same area as post op pain and worse supine so sleeping on 3 pillows.  No obvious day to day or daytime variabilty or assoc   chest tightness, subjective wheeze overt sinus  or hb symptoms. No unusual exp hx or h/o childhood pna/ asthma or knowledge of premature birth.  Sleeping ok without nocturnal  or early am exacerbation  of respiratory  c/o's or need for noct saba. Also denies any obvious fluctuation of symptoms with weather or environmental changes or other aggravating or alleviating factors except as outlined above   Current Medications, Allergies, Complete Past Medical History, Past Surgical History, Family History, and Social History were reviewed in [employer] record.  ROS  The following are not active complaints unless bolded sore throat, dysphagia, dental problems, itching, sneezing,  nasal congestion or excess/ purulent secretions, ear ache,   fever, chills, sweats, unintended wt loss,  exertional cp, hemoptysis,  orthopnea pnd or leg swelling, presyncope,  palpitations, heartburn, abdominal pain, anorexia, nausea, vomiting, diarrhea  or change in bowel or urinary habits, change in stools or urine, dysuria,hematuria,  rash, arthralgias, visual complaints, headache, numbness weakness or ataxia or problems with walking or coordination,  change in mood/affect or memory.             .                   Objective:   Physical Exam  amb wm nad  05/31/2014      183 > 06/12/2014 181  Wt Readings from Last 3 Encounters:  05/17/14 185 lb (83.915 kg)  05/01/14 182 lb (82.555 kg)  01/23/14 180 lb (81.647 kg)    Vital signs reviewed   HEENT: nl dentition, turbinates, and orophanx. Nl external ear canals without cough reflex   NECK :  without JVD/Nodes/TM/ nl carotid upstrokes bilaterally   LUNGS: no acc muscle use, very minimal bilateral insp and exp rhonchi/ new decreased bs r base   CV:  RRR  no s3 or murmur or increase in P2, no edema   ABD:  soft and nontender with nl excursion in the supine position. No bruits or organomegaly, bowel sounds nl  MS:  warm without deformities, calf tenderness, cyanosis or clubbing  SKIN: warm and dry without lesions    NEURO:  alert, approp, no deficits    CT chest 03/23/14  GG changes sup seg LLL no change since March 2015 muliple tiny non-specific nodules Small HH   Sputum 04/19/14 pos strep/kleb/serratia all sensitive to cipro   cxr 05/21/14  Stable postsurgical changes in right hemithorax. No acute findings.  CT 06/09/14 with contrast 1. New large right pleural effusion. 2. Stable small right lung pulmonary nodules along the medial pleural surface. 3. Increase in lymph nodes in the prevascular space adjacent to the right atrium and anterior to the liver.      Recent Labs Lab 06/09/14 1455  NA 137  K 4.4  CL 107  CO2 26  BUN 19  CREATININE 1.17  GLUCOSE 95    Recent Labs Lab 06/09/14 1455  HGB 11.3*  HCT 34.7*  WBC 8.1  PLT 290          Assessment & Plan:

## 2014-06-13 ENCOUNTER — Ambulatory Visit (HOSPITAL_COMMUNITY): Payer: Medicare Other

## 2014-06-13 LAB — AMYLASE, PLEURAL FLUID: Amylase, Pleural Fluid: 21 U/L

## 2014-06-17 LAB — BODY FLUID CULTURE
Culture: NO GROWTH
Gram Stain: NONE SEEN

## 2014-06-18 ENCOUNTER — Ambulatory Visit: Payer: Medicare Other | Admitting: Thoracic Surgery (Cardiothoracic Vascular Surgery)

## 2014-06-18 LAB — OTHER BODY FLUID CHEMISTRY

## 2014-06-20 ENCOUNTER — Other Ambulatory Visit: Payer: Self-pay | Admitting: *Deleted

## 2014-06-20 DIAGNOSIS — R911 Solitary pulmonary nodule: Secondary | ICD-10-CM

## 2014-06-21 ENCOUNTER — Other Ambulatory Visit: Payer: Self-pay | Admitting: Internal Medicine

## 2014-06-21 ENCOUNTER — Other Ambulatory Visit (HOSPITAL_COMMUNITY)
Admission: RE | Admit: 2014-06-21 | Discharge: 2014-06-21 | Disposition: A | Discharge status: Home / Self Care | Payer: Medicare Other | Source: Ambulatory Visit | Attending: Internal Medicine | Admitting: Internal Medicine

## 2014-06-21 ENCOUNTER — Ambulatory Visit: Payer: Medicare Other | Admitting: Internal Medicine

## 2014-06-21 ENCOUNTER — Telehealth: Payer: Self-pay | Admitting: Internal Medicine

## 2014-06-21 ENCOUNTER — Inpatient Hospital Stay: Admission: RE | Admit: 2014-06-21 | Payer: Medicare Other | Source: Ambulatory Visit

## 2014-06-21 DIAGNOSIS — C3411 Malignant neoplasm of upper lobe, right bronchus or lung: Secondary | ICD-10-CM | POA: Diagnosis present

## 2014-06-21 NOTE — Telephone Encounter (Signed)
Thanks for the [facility] and will send to MW to make him aware.

## 2014-06-26 ENCOUNTER — Encounter (HOSPITAL_COMMUNITY): Payer: Self-pay

## 2014-06-26 ENCOUNTER — Ambulatory Visit: Payer: Self-pay | Admitting: Thoracic Surgery (Cardiothoracic Vascular Surgery)

## 2014-06-28 ENCOUNTER — Encounter (HOSPITAL_COMMUNITY): Payer: Self-pay

## 2014-07-02 ENCOUNTER — Other Ambulatory Visit: Payer: Self-pay | Admitting: *Deleted

## 2014-07-02 DIAGNOSIS — Z8709 Personal history of other diseases of the respiratory system: Secondary | ICD-10-CM

## 2014-07-03 ENCOUNTER — Other Ambulatory Visit: Payer: Self-pay | Admitting: *Deleted

## 2014-07-03 ENCOUNTER — Ambulatory Visit
Admission: RE | Admit: 2014-07-03 | Discharge: 2014-07-03 | Disposition: A | Discharge status: Home / Self Care | Payer: Medicare Other | Source: Ambulatory Visit | Attending: Thoracic Surgery (Cardiothoracic Vascular Surgery) | Admitting: Thoracic Surgery (Cardiothoracic Vascular Surgery)

## 2014-07-03 ENCOUNTER — Encounter: Payer: Self-pay | Admitting: Thoracic Surgery (Cardiothoracic Vascular Surgery)

## 2014-07-03 ENCOUNTER — Ambulatory Visit (INDEPENDENT_AMBULATORY_CARE_PROVIDER_SITE_OTHER): Payer: Medicare Other | Admitting: Thoracic Surgery (Cardiothoracic Vascular Surgery)

## 2014-07-03 VITALS — BP 112/70 | HR 83 | Resp 18 | Ht 67.0 in | Wt 182.0 lb

## 2014-07-03 DIAGNOSIS — J910 Malignant pleural effusion: Secondary | ICD-10-CM

## 2014-07-03 DIAGNOSIS — Z902 Acquired absence of lung [part of]: Secondary | ICD-10-CM

## 2014-07-03 DIAGNOSIS — C3491 Malignant neoplasm of unspecified part of right bronchus or lung: Secondary | ICD-10-CM

## 2014-07-03 DIAGNOSIS — Z8709 Personal history of other diseases of the respiratory system: Secondary | ICD-10-CM

## 2014-07-03 DIAGNOSIS — C3490 Malignant neoplasm of unspecified part of unspecified bronchus or lung: Secondary | ICD-10-CM

## 2014-07-03 DIAGNOSIS — J948 Other specified pleural conditions: Secondary | ICD-10-CM

## 2014-07-03 DIAGNOSIS — Z9889 Other specified postprocedural states: Secondary | ICD-10-CM

## 2014-07-03 DIAGNOSIS — J90 Pleural effusion, not elsewhere classified: Secondary | ICD-10-CM

## 2014-07-03 MED ORDER — HYDROCODONE-ACETAMINOPHEN 5-325 MG PO TABS: 1.0000 | ORAL_TABLET | Freq: Four times a day (QID) | ORAL | Status: AC | PRN

## 2014-07-03 NOTE — Progress Notes (Signed)
HPI:  Henry Hudson returns today for evaluation of his right pleural effusion.  He is an 81 year old gentleman with a history of a stage IB non-small cell carcinoma. I did a thoracoscopic right upper and middle lobectomy on him in May 2015. He did reasonably well postoperatively but had a persistent cough. He saw Dr. Melvyn Novas in December. At that time he developed a new right pleural effusion. A thoracentesis was done which resulted in symptomatic relief. Cytology of the fluid was positive for metastatic adenocarcinoma.  He did have symptomatic relief after the thoracentesis. However his shortness of breath and right-sided chest discomfort have returned over the past week or so.  He had a stage IB non-small cell carcinoma. There was visceral pleural involvement. All of his nodes were negative.  Past Medical History  Diagnosis Date  . Chest pain, unspecified     2 Vessel Coronary artery Disease  . Intermediate coronary syndrome   . Unspecified hypertensive kidney disease with chronic kidney disease stage I through stage IV, or unspecified   . Other and unspecified hyperlipidemia   . Hypopotassemia   . Other specified cardiac dysrhythmias(427.89)   . Chronic kidney disease, unspecified   . Personal history of tobacco use, presenting hazards to health   . Hypertension   . Coronary artery disease 2009  . Arthritis   . Cancer     lung cancer   . BPH (benign prostatic hypertrophy)     Takes Flomax  . Head ache    Past Surgical History  Procedure Laterality Date  . Remote lumbar laminectomy    . Left knee replacement      [facility]  . Stent placement 04/2008 x 2    . Coronary angioplasty  2009    2 stents  . Eye surgery Bilateral     cataract surgery  . Vasectomy    . Video assisted thoracoscopy (vats)/ lobectomy Right 10/18/2013    Procedure: RIGHT VIDEO ASSISTED THORACOSCOPY RIGHT UPPER LOBECTOMY LUNG &  RIGHT MIDDLE LOBECTOMY LUNG,& NODE DISSECTION ;  Surgeon: Melrose Nakayama, MD;   Location: [facility];  Service: Thoracic;  Laterality: Right;    Current Outpatient Prescriptions  Medication Sig Dispense Refill  . amLODipine (NORVASC) 5 MG tablet Take 5 mg by mouth daily.     Marland Kitchen aspirin EC 81 MG tablet Take 81 mg by mouth 2 (two) times daily.    Marland Kitchen HYDROcodone-acetaminophen (NORCO) 5-325 MG per tablet Take 1-2 tablets by mouth every 6 (six) hours as needed for moderate pain. 40 tablet 0  . pantoprazole (PROTONIX) 40 MG tablet Take 30- 60 min before your first and last meals of the day (Patient taking differently: Take 40 mg by mouth 2 (two) times daily before a meal. Take 30- 60 min before your first and last meals of the day) 60 tablet 2  . tamsulosin (FLOMAX) 0.4 MG CAPS capsule Take 0.4 mg by mouth every evening.     . carvedilol (COREG) 12.5 MG tablet Take 12.5 mg by mouth 2 (two) times daily.  2   No current facility-administered medications for this visit.   Family History  Problem Relation Age of Onset  . Heart failure Mother 63  . COPD Mother   . Heart failure Father 68  . Lung cancer Brother     smoked   History   Social History  . Marital Status: Married    Spouse Name: N/A    Number of Children: 3  . Years of Education: N/A  Occupational History  . RETIRED     electrician   Social History Main Topics  . Smoking status: Former Smoker -- 1.00 packs/day for 50 years    Types: Cigarettes    Quit date: 06/15/1993  . Smokeless tobacco: Never Used  . Alcohol Use: No  . Drug Use: No  . Sexual Activity: Not on file   Other Topics Concern  . Not on file   Social History Narrative     Physical Exam BP 112/70 mmHg  Pulse 83  Resp 18  Ht 5\' 7"  (1.702 m)  Wt 182 lb (82.555 kg)  BMI 28.50 kg/m2  SpO2 95% Elderly man in no acute distress Well-developed well-nourished Alert and oriented 3 with no focal neurologic deficits No cervical or suprapubic or adenopathy Diminished breath sounds and dullness to percussion at right base, lungs otherwise  clear Cardiac regular rate and rhythm normal S1 and S2 Abdomen soft nontender Chest incision well-healed No peripheral edema  Diagnostic Tests: I reviewed his chest x-ray from today, as well as his CT and chest x-rays from December. It shows reaccumulation of his right pleural effusion  Impression: 81 year old gentleman with early recurrence of lung cancer. He had a stage IB cancer resected with a right upper and middle lobectomy which was done thoracoscopically in May 2015. His nodes were negative. He now presents with a malignant pleural effusion. He had this drained about 3 weeks ago with good symptomatic relief, but over the past week has developed recurrent symptoms of cough, chest discomfort and shortness of breath.   He will need a longer term solution for his pleural effusion and re-referral to oncology for consideration for chemotherapy. He saw Dr. Tressie Stalker preoperatively, so we will get Henry Hudson an appointment with him.  Given his advanced age and overall functional status, I think the least invasive approach for management of the pleural effusion will be the best for Henry Hudson. My recommendation and was that we place a right Pleurx catheter. He understands that this is an indwelling catheter that will need frequent drainage. We will arrange for a home health nurse to assist with that.  I described the procedure to him. We will place this in the operating room under local anesthesia with intravenous sedation. We would do this on an outpatient basis. The catheter than would need daily care initially. I reviewed with Henry Hudson and his family the indications, risks, benefits, and alternatives. They understand the risks include but are not limited to bleeding, infection, catheter malposition, pneumothorax, as well as the possibility of unforeseeable complications.  In addition to management of the right pleural effusion. He needs a PET/CT and MRI of the brain to rule out any other sites  of recurrence.  Plan: Right Pleurx catheter placement on Thursday, January 21 PET/CT MR brain Re-referral to Dr. Tressie Stalker for consideration for chemotherapy

## 2014-07-04 ENCOUNTER — Encounter (HOSPITAL_COMMUNITY): Payer: Self-pay | Admitting: *Deleted

## 2014-07-04 MED ORDER — VANCOMYCIN HCL IN DEXTROSE 1-5 GM/200ML-% IV SOLN
1000.0000 mg | INTRAVENOUS | Status: AC
  Administered 2014-07-05: 1000 mg via INTRAVENOUS
  Filled 2014-07-04: qty 200

## 2014-07-04 NOTE — Progress Notes (Signed)
Pt requested that I talk with his daughter, Stanton Kidney for his pre-op call.  Stop Bang Assessment sent to PCP.

## 2014-07-04 NOTE — Progress Notes (Signed)
   07/04/14 1334  OBSTRUCTIVE SLEEP APNEA  Have you ever been diagnosed with sleep apnea through a sleep study? No  Do you snore loudly (loud enough to be heard through closed doors)?  0  Do you often feel tired, fatigued, or sleepy during the daytime? 0  Has anyone observed you stop breathing during your sleep? 0  Do you have, or are you being treated for high blood pressure? 1  BMI more than 35 kg/m2? 0  Age over 79 years old? 1  Neck circumference greater than 40 cm/16 inches? 1  Gender: 1  Obstructive Sleep Apnea Score 4  Score 4 or greater  Results sent to PCP

## 2014-07-05 ENCOUNTER — Encounter (HOSPITAL_COMMUNITY)
Admission: RE | Disposition: A | Discharge status: Home / Self Care | Payer: Self-pay | Source: Ambulatory Visit | Attending: Thoracic Surgery (Cardiothoracic Vascular Surgery)

## 2014-07-05 ENCOUNTER — Ambulatory Visit (HOSPITAL_COMMUNITY): Payer: Medicare Other | Admitting: Certified Registered"

## 2014-07-05 ENCOUNTER — Ambulatory Visit (HOSPITAL_COMMUNITY): Payer: Medicare Other

## 2014-07-05 ENCOUNTER — Ambulatory Visit (HOSPITAL_COMMUNITY)
Admission: RE | Admit: 2014-07-05 | Discharge: 2014-07-05 | Disposition: A | Discharge status: Home / Self Care | Payer: Medicare Other | Source: Ambulatory Visit | Attending: Thoracic Surgery (Cardiothoracic Vascular Surgery) | Admitting: Thoracic Surgery (Cardiothoracic Vascular Surgery)

## 2014-07-05 ENCOUNTER — Encounter (HOSPITAL_COMMUNITY): Payer: Self-pay | Admitting: *Deleted

## 2014-07-05 DIAGNOSIS — N189 Chronic kidney disease, unspecified: Secondary | ICD-10-CM | POA: Diagnosis not present

## 2014-07-05 DIAGNOSIS — Z87891 Personal history of nicotine dependence: Secondary | ICD-10-CM | POA: Diagnosis not present

## 2014-07-05 DIAGNOSIS — Z7982 Long term (current) use of aspirin: Secondary | ICD-10-CM | POA: Insufficient documentation

## 2014-07-05 DIAGNOSIS — E785 Hyperlipidemia, unspecified: Secondary | ICD-10-CM | POA: Diagnosis not present

## 2014-07-05 DIAGNOSIS — J910 Malignant pleural effusion: Secondary | ICD-10-CM | POA: Insufficient documentation

## 2014-07-05 DIAGNOSIS — M1990 Unspecified osteoarthritis, unspecified site: Secondary | ICD-10-CM | POA: Diagnosis not present

## 2014-07-05 DIAGNOSIS — Z85118 Personal history of other malignant neoplasm of bronchus and lung: Secondary | ICD-10-CM | POA: Insufficient documentation

## 2014-07-05 DIAGNOSIS — N400 Enlarged prostate without lower urinary tract symptoms: Secondary | ICD-10-CM | POA: Insufficient documentation

## 2014-07-05 DIAGNOSIS — I129 Hypertensive chronic kidney disease with stage 1 through stage 4 chronic kidney disease, or unspecified chronic kidney disease: Secondary | ICD-10-CM | POA: Diagnosis not present

## 2014-07-05 DIAGNOSIS — I2510 Atherosclerotic heart disease of native coronary artery without angina pectoris: Secondary | ICD-10-CM | POA: Diagnosis not present

## 2014-07-05 DIAGNOSIS — J90 Pleural effusion, not elsewhere classified: Secondary | ICD-10-CM

## 2014-07-05 HISTORY — PX: CHEST TUBE INSERTION: SHX231

## 2014-07-05 LAB — CBC
HEMATOCRIT: 32.5 % — AB (ref 39.0–52.0)
Hemoglobin: 10.9 g/dL — ABNORMAL LOW (ref 13.0–17.0)
MCH: 28.8 pg (ref 26.0–34.0)
MCHC: 33.5 g/dL (ref 30.0–36.0)
MCV: 86 fL (ref 78.0–100.0)
PLATELETS: 256 10*3/uL (ref 150–400)
RBC: 3.78 MIL/uL — AB (ref 4.22–5.81)
RDW: 13.5 % (ref 11.5–15.5)
WBC: 10.1 10*3/uL (ref 4.0–10.5)

## 2014-07-05 LAB — COMPREHENSIVE METABOLIC PANEL
ALK PHOS: 78 U/L (ref 39–117)
ALT: 12 U/L (ref 0–53)
AST: 16 U/L (ref 0–37)
Albumin: 3.1 g/dL — ABNORMAL LOW (ref 3.5–5.2)
Anion gap: 9 (ref 5–15)
BUN: 16 mg/dL (ref 6–23)
CALCIUM: 8.7 mg/dL (ref 8.4–10.5)
CO2: 22 mmol/L (ref 19–32)
Chloride: 106 mEq/L (ref 96–112)
Creatinine, Ser: 1.11 mg/dL (ref 0.50–1.35)
GFR calc Af Amer: 68 mL/min — ABNORMAL LOW (ref >90)
GFR calc non Af Amer: 59 mL/min — ABNORMAL LOW (ref >90)
Glucose, Bld: 109 mg/dL — ABNORMAL HIGH (ref 70–99)
POTASSIUM: 3.8 mmol/L (ref 3.5–5.1)
Sodium: 137 mmol/L (ref 135–145)
TOTAL PROTEIN: 6 g/dL (ref 6.0–8.3)
Total Bilirubin: 0.7 mg/dL (ref 0.3–1.2)

## 2014-07-05 LAB — PROTIME-INR
INR: 1.08 (ref 0.00–1.49)
PROTHROMBIN TIME: 14.1 s (ref 11.6–15.2)

## 2014-07-05 LAB — APTT: aPTT: 29 seconds (ref 24–37)

## 2014-07-05 SURGERY — INSERTION, PLEURAL DRAINAGE CATHETER
Anesthesia: Monitor Anesthesia Care | Laterality: Right

## 2014-07-05 MED ORDER — PANTOPRAZOLE SODIUM 40 MG PO TBEC: 40.0000 mg | DELAYED_RELEASE_TABLET | Freq: Two times a day (BID) | ORAL | Status: AC

## 2014-07-05 MED ORDER — DEXMEDETOMIDINE HCL IN NACL 200 MCG/50ML IV SOLN
INTRAVENOUS | Status: DC | PRN
  Administered 2014-07-05: 0.5 ug/kg/h via INTRAVENOUS

## 2014-07-05 MED ORDER — ONDANSETRON HCL 4 MG/2ML IJ SOLN: 4.0000 mg | Freq: Once | INTRAMUSCULAR | Status: DC | PRN

## 2014-07-05 MED ORDER — LACTATED RINGERS IV SOLN
INTRAVENOUS | Status: DC | PRN
  Administered 2014-07-05: 14:00:00 via INTRAVENOUS

## 2014-07-05 MED ORDER — FENTANYL CITRATE 0.05 MG/ML IJ SOLN
INTRAMUSCULAR | Status: DC | PRN
  Administered 2014-07-05: 50 ug via INTRAVENOUS
  Administered 2014-07-05: 100 ug via INTRAVENOUS

## 2014-07-05 MED ORDER — FENTANYL CITRATE 0.05 MG/ML IJ SOLN: 25.0000 ug | INTRAMUSCULAR | Status: DC | PRN

## 2014-07-05 MED ORDER — LIDOCAINE HCL (PF) 1 % IJ SOLN
INTRAMUSCULAR | Status: AC
  Filled 2014-07-05: qty 30

## 2014-07-05 MED ORDER — DEXTROSE IN LACTATED RINGERS 5 % IV SOLN: INTRAVENOUS | Status: DC

## 2014-07-05 MED ORDER — LIDOCAINE HCL (PF) 1 % IJ SOLN
INTRAMUSCULAR | Status: AC
  Filled 2014-07-05: qty 10

## 2014-07-05 MED ORDER — MIDAZOLAM HCL 5 MG/5ML IJ SOLN
INTRAMUSCULAR | Status: DC | PRN
  Administered 2014-07-05: 1 mg via INTRAVENOUS

## 2014-07-05 MED ORDER — FENTANYL CITRATE 0.05 MG/ML IJ SOLN
INTRAMUSCULAR | Status: AC
  Filled 2014-07-05: qty 5

## 2014-07-05 MED ORDER — MIDAZOLAM HCL 2 MG/2ML IJ SOLN
INTRAMUSCULAR | Status: AC
  Filled 2014-07-05: qty 2

## 2014-07-05 MED ORDER — PHENYLEPHRINE HCL 10 MG/ML IJ SOLN
INTRAMUSCULAR | Status: DC | PRN
  Administered 2014-07-05 (×3): 80 ug via INTRAVENOUS

## 2014-07-05 MED ORDER — VANCOMYCIN HCL IN DEXTROSE 1-5 GM/200ML-% IV SOLN
1000.0000 mg | INTRAVENOUS | Status: AC
  Administered 2014-07-05: 1000 mg via INTRAVENOUS
  Filled 2014-07-05: qty 200

## 2014-07-05 MED ORDER — LACTATED RINGERS IV SOLN
INTRAVENOUS | Status: DC
  Administered 2014-07-05: 13:00:00 via INTRAVENOUS

## 2014-07-05 MED ORDER — LIDOCAINE HCL 1 % IJ SOLN
INTRAMUSCULAR | Status: DC | PRN
  Administered 2014-07-05: 15 mL

## 2014-07-05 MED ORDER — 0.9 % SODIUM CHLORIDE (POUR BTL) OPTIME
TOPICAL | Status: DC | PRN
  Administered 2014-07-05: 1000 mL

## 2014-07-05 SURGICAL SUPPLY — 30 items
ADH SKN CLS APL DERMABOND .7 (GAUZE/BANDAGES/DRESSINGS) ×1
CANISTER SUCTION 2500CC (MISCELLANEOUS) ×3 IMPLANT
COVER SURGICAL LIGHT HANDLE (MISCELLANEOUS) ×3 IMPLANT
DERMABOND ADVANCED (GAUZE/BANDAGES/DRESSINGS) ×2
DERMABOND ADVANCED .7 DNX12 (GAUZE/BANDAGES/DRESSINGS) ×1 IMPLANT
DRAPE C-ARM 42X72 X-RAY (DRAPES) ×3 IMPLANT
DRAPE LAPAROSCOPIC ABDOMINAL (DRAPES) ×3 IMPLANT
DRSG OPSITE 6X11 MED (GAUZE/BANDAGES/DRESSINGS) ×3 IMPLANT
GLOVE BIO SURGEON STRL SZ7 (GLOVE) ×2 IMPLANT
GLOVE BIO SURGEON STRL SZ7.5 (GLOVE) ×2 IMPLANT
GLOVE BIOGEL PI IND STRL 7.5 (GLOVE) IMPLANT
GLOVE BIOGEL PI INDICATOR 7.5 (GLOVE) ×2
GLOVE SURG SIGNA 7.5 PF LTX (GLOVE) ×3 IMPLANT
GOWN STRL REUS W/ TWL LRG LVL3 (GOWN DISPOSABLE) ×1 IMPLANT
GOWN STRL REUS W/ TWL XL LVL3 (GOWN DISPOSABLE) ×1 IMPLANT
GOWN STRL REUS W/TWL LRG LVL3 (GOWN DISPOSABLE) ×6
GOWN STRL REUS W/TWL XL LVL3 (GOWN DISPOSABLE) ×6
KIT BASIN OR (CUSTOM PROCEDURE TRAY) ×3 IMPLANT
KIT PLEURX DRAIN CATH 1000ML (MISCELLANEOUS) ×5 IMPLANT
KIT PLEURX DRAIN CATH 15.5FR (DRAIN) ×3 IMPLANT
KIT ROOM TURNOVER OR (KITS) ×3 IMPLANT
NS IRRIG 1000ML POUR BTL (IV SOLUTION) ×3 IMPLANT
PACK GENERAL/GYN (CUSTOM PROCEDURE TRAY) ×3 IMPLANT
PAD ARMBOARD 7.5X6 YLW CONV (MISCELLANEOUS) ×6 IMPLANT
SET DRAINAGE LINE (MISCELLANEOUS) IMPLANT
SUT ETHILON 3 0 FSL (SUTURE) ×3 IMPLANT
SUT VIC AB 3-0 X1 27 (SUTURE) ×3 IMPLANT
TOWEL OR 17X24 6PK STRL BLUE (TOWEL DISPOSABLE) ×3 IMPLANT
TOWEL OR 17X26 10 PK STRL BLUE (TOWEL DISPOSABLE) ×3 IMPLANT
WATER STERILE IRR 1000ML POUR (IV SOLUTION) ×3 IMPLANT

## 2014-07-05 NOTE — Discharge Instructions (Addendum)
Do not drive or engage in heavy physical activity for 24 hours  After 24 hours you may resume normal activities  We will arrange a home health nurse to assist you with draining the catheter  You may use the hydrocodone you already have for pain as needed  My office will contact you with follow up information  Call [phone] if you develop chest pain, shortness of breath, notice redness around catheter site or drainage around catheter, or develop a fever > 101  What to eat:  For your first meals, you should eat lightly; only small meals initially.  If you do not have nausea, you may eat larger meals.  Avoid spicy, greasy and heavy food.    General Anesthesia, Adult, Care After  Refer to this sheet in the next few weeks. These instructions provide you with information on caring for yourself after your procedure. Your health care provider may also give you more specific instructions. Your treatment has been planned according to current medical practices, but problems sometimes occur. Call your health care provider if you have any problems or questions after your procedure.  WHAT TO EXPECT AFTER THE PROCEDURE  After the procedure, it is typical to experience:  Sleepiness.  Nausea and vomiting. HOME CARE INSTRUCTIONS  For the first 24 hours after general anesthesia:  Have a responsible person with you.  Do not drive a car. If you are alone, do not take public transportation.  Do not drink alcohol.  Do not take medicine that has not been prescribed by your health care provider.  Do not sign important papers or make important decisions.  You may resume a normal diet and activities as directed by your health care provider.  Change bandages (dressings) as directed.  If you have questions or problems that seem related to general anesthesia, call the hospital and ask for the anesthetist or anesthesiologist on call. SEEK MEDICAL CARE IF:  You have nausea and vomiting that continue the day after  anesthesia.  You develop a rash. SEEK IMMEDIATE MEDICAL CARE IF:  You have difficulty breathing.  You have chest pain.  You have any allergic problems. Document Released: 09/07/2000 Document Revised: 02/01/2013 Document Reviewed: 12/15/2012  [facility] Patient Information 2014 [place], [place].   **I have made a referral to [facility] to assist with draining your catheter. I have faxed the order for extra canisters to [facility] at [phone]. Their phone number is [phone]. Thank you for choosing [facility]! Stay warm and safe! Deveron Furlong RN BSN ACM [phone]

## 2014-07-05 NOTE — Transfer of Care (Signed)
Immediate Anesthesia Transfer of Care Note  Patient: Henry Hudson  Procedure(s) Performed: Procedure(s): INSERTION PLEURAL DRAINAGE CATHETER (Right)  Patient Location: PACU  Anesthesia Type:MAC  Level of Consciousness: awake, alert , oriented and patient cooperative  Airway & Oxygen Therapy: Patient Spontanous Breathing  Post-op Assessment: Report given to PACU RN, Post -op Vital signs reviewed and stable and Patient moving all extremities  Post vital signs: Reviewed and stable  Complications: No apparent anesthesia complications

## 2014-07-05 NOTE — Anesthesia Postprocedure Evaluation (Signed)
  Anesthesia Post-op Note  Patient: Henry Hudson  Procedure(s) Performed: Procedure(s): INSERTION PLEURAL DRAINAGE CATHETER (Right)  Patient Location: PACU  Anesthesia Type:MAC  Level of Consciousness: awake, alert  and oriented  Airway and Oxygen Therapy: Patient Spontanous Breathing and Patient connected to nasal cannula oxygen  Post-op Pain: mild  Post-op Assessment: Post-op Vital signs reviewed, Patient's Cardiovascular Status Stable, Respiratory Function Stable, Patent Airway and Pain level controlled  Post-op Vital Signs: stable  Last Vitals:  Filed Vitals:   07/05/14 1627  BP: 156/79  Pulse: 78  Temp:   Resp: 15    Complications: No apparent anesthesia complications

## 2014-07-05 NOTE — Brief Op Note (Signed)
07/05/2014  3:29 PM  PATIENT:  Henry Hudson  81 y.o. male  PRE-OPERATIVE DIAGNOSIS:  MALIGNANT RIGHT PLEURAL EFFUSION  POST-OPERATIVE DIAGNOSIS:  MALIGNANT RIGHT PLEURAL EFFUSION  PROCEDURE:  Procedure(s): INSERTION PLEURAL DRAINAGE CATHETER (Right)  SURGEON:  Surgeon(s) and Role:    * Melrose Nakayama, MD - Primary  PHYSICIAN ASSISTANT:   ASSISTANTS: none   ANESTHESIA:   local and IV sedation  EBL:  Total I/O In: -  Out: 1850 [Other:1850]  BLOOD ADMINISTERED:none  DRAINS: pleural catheter   LOCAL MEDICATIONS USED:  LIDOCAINE  and Amount: 15 ml  SPECIMEN:  No Specimen  DISPOSITION OF SPECIMEN:  N/A  COUNTS:  YES  PLAN OF CARE: Discharge to home after PACU  PATIENT DISPOSITION:  PACU - hemodynamically stable.   Delay start of Pharmacological VTE agent (>24hrs) due to surgical blood loss or risk of bleeding: not applicable  6.46 L of serous fluid evacuated

## 2014-07-05 NOTE — H&P (View-Only) (Signed)
HPI:  Henry Hudson returns today for evaluation of his right pleural effusion.  He is an 81 year old gentleman with a history of a stage IB non-small cell carcinoma. I did a thoracoscopic right upper and middle lobectomy on him in May 2015. He did reasonably well postoperatively but had a persistent cough. He saw Dr. Melvyn Novas in December. At that time he developed a new right pleural effusion. A thoracentesis was done which resulted in symptomatic relief. Cytology of the fluid was positive for metastatic adenocarcinoma.  He did have symptomatic relief after the thoracentesis. However his shortness of breath and right-sided chest discomfort have returned over the past week or so.  He had a stage IB non-small cell carcinoma. There was visceral pleural involvement. All of his nodes were negative.  Past Medical History  Diagnosis Date  . Chest pain, unspecified     2 Vessel Coronary artery Disease  . Intermediate coronary syndrome   . Unspecified hypertensive kidney disease with chronic kidney disease stage I through stage IV, or unspecified   . Other and unspecified hyperlipidemia   . Hypopotassemia   . Other specified cardiac dysrhythmias(427.89)   . Chronic kidney disease, unspecified   . Personal history of tobacco use, presenting hazards to health   . Hypertension   . Coronary artery disease 2009  . Arthritis   . Cancer     lung cancer   . BPH (benign prostatic hypertrophy)     Takes Flomax  . Head ache    Past Surgical History  Procedure Laterality Date  . Remote lumbar laminectomy    . Left knee replacement      [facility]  . Stent placement 04/2008 x 2    . Coronary angioplasty  2009    2 stents  . Eye surgery Bilateral     cataract surgery  . Vasectomy    . Video assisted thoracoscopy (vats)/ lobectomy Right 10/18/2013    Procedure: RIGHT VIDEO ASSISTED THORACOSCOPY RIGHT UPPER LOBECTOMY LUNG &  RIGHT MIDDLE LOBECTOMY LUNG,& NODE DISSECTION ;  Surgeon: Melrose Nakayama, MD;   Location: [facility];  Service: Thoracic;  Laterality: Right;    Current Outpatient Prescriptions  Medication Sig Dispense Refill  . amLODipine (NORVASC) 5 MG tablet Take 5 mg by mouth daily.     Marland Kitchen aspirin EC 81 MG tablet Take 81 mg by mouth 2 (two) times daily.    Marland Kitchen HYDROcodone-acetaminophen (NORCO) 5-325 MG per tablet Take 1-2 tablets by mouth every 6 (six) hours as needed for moderate pain. 40 tablet 0  . pantoprazole (PROTONIX) 40 MG tablet Take 30- 60 min before your first and last meals of the day (Patient taking differently: Take 40 mg by mouth 2 (two) times daily before a meal. Take 30- 60 min before your first and last meals of the day) 60 tablet 2  . tamsulosin (FLOMAX) 0.4 MG CAPS capsule Take 0.4 mg by mouth every evening.     . carvedilol (COREG) 12.5 MG tablet Take 12.5 mg by mouth 2 (two) times daily.  2   No current facility-administered medications for this visit.   Family History  Problem Relation Age of Onset  . Heart failure Mother 65  . COPD Mother   . Heart failure Father 86  . Lung cancer Brother     smoked   History   Social History  . Marital Status: Married    Spouse Name: N/A    Number of Children: 3  . Years of Education: N/A  Occupational History  . RETIRED     electrician   Social History Main Topics  . Smoking status: Former Smoker -- 1.00 packs/day for 50 years    Types: Cigarettes    Quit date: 06/15/1993  . Smokeless tobacco: Never Used  . Alcohol Use: No  . Drug Use: No  . Sexual Activity: Not on file   Other Topics Concern  . Not on file   Social History Narrative     Physical Exam BP 112/70 mmHg  Pulse 83  Resp 18  Ht 5\' 7"  (1.702 m)  Wt 182 lb (82.555 kg)  BMI 28.50 kg/m2  SpO2 95% Elderly man in no acute distress Well-developed well-nourished Alert and oriented 3 with no focal neurologic deficits No cervical or suprapubic or adenopathy Diminished breath sounds and dullness to percussion at right base, lungs otherwise  clear Cardiac regular rate and rhythm normal S1 and S2 Abdomen soft nontender Chest incision well-healed No peripheral edema  Diagnostic Tests: I reviewed his chest x-ray from today, as well as his CT and chest x-rays from December. It shows reaccumulation of his right pleural effusion  Impression: 82 year old gentleman with early recurrence of lung cancer. He had a stage IB cancer resected with a right upper and middle lobectomy which was done thoracoscopically in May 2015. His nodes were negative. He now presents with a malignant pleural effusion. He had this drained about 3 weeks ago with good symptomatic relief, but over the past week has developed recurrent symptoms of cough, chest discomfort and shortness of breath.   He will need a longer term solution for his pleural effusion and re-referral to oncology for consideration for chemotherapy. He saw Dr. Tressie Stalker preoperatively, so we will get Henry Hudson an appointment with him.  Given his advanced age and overall functional status, I think the least invasive approach for management of the pleural effusion will be the best for Henry Hudson. My recommendation and was that we place a right Pleurx catheter. He understands that this is an indwelling catheter that will need frequent drainage. We will arrange for a home health nurse to assist with that.  I described the procedure to him. We will place this in the operating room under local anesthesia with intravenous sedation. We would do this on an outpatient basis. The catheter than would need daily care initially. I reviewed with Henry Hudson and his family the indications, risks, benefits, and alternatives. They understand the risks include but are not limited to bleeding, infection, catheter malposition, pneumothorax, as well as the possibility of unforeseeable complications.  In addition to management of the right pleural effusion. He needs a PET/CT and MRI of the brain to rule out any other sites  of recurrence.  Plan: Right Pleurx catheter placement on Thursday, January 21 PET/CT MR brain Re-referral to Dr. Tressie Stalker for consideration for chemotherapy

## 2014-07-05 NOTE — Anesthesia Preprocedure Evaluation (Addendum)
Anesthesia Evaluation  Patient identified by MRN, date of birth, ID band Patient awake    Reviewed: Allergy & Precautions, NPO status , Patient's Chart, lab work & pertinent test results  Airway Mallampati: III  TM Distance: >3 FB Neck ROM: Full    Dental  (+) Edentulous Upper   Pulmonary shortness of breath, former smoker,    + decreased breath sounds      Cardiovascular hypertension, + angina + CAD and + Cardiac Stents Rhythm:Regular Rate:Normal     Neuro/Psych    GI/Hepatic   Endo/Other    Renal/GU Renal disease     Musculoskeletal  (+) Arthritis -,   Abdominal   Peds  Hematology   Anesthesia Other Findings   Reproductive/Obstetrics                            Anesthesia Physical Anesthesia Plan  ASA: III  Anesthesia Plan: MAC   Post-op Pain Management:    Induction:   Airway Management Planned: Simple Face Mask and Natural Airway  Additional Equipment:   Intra-op Plan:   Post-operative Plan:   Informed Consent: I have reviewed the patients History and Physical, chart, labs and discussed the procedure including the risks, benefits and alternatives for the proposed anesthesia with the patient or authorized representative who has indicated his/her understanding and acceptance.     Plan Discussed with: CRNA and Anesthesiologist  Anesthesia Plan Comments:        Anesthesia Quick Evaluation

## 2014-07-05 NOTE — Interval H&P Note (Signed)
History and Physical Interval Note:  07/05/2014 1:53 PM  Henry Hudson  has presented today for surgery, with the diagnosis of MALIGNANT RIGHT PLEURAL EFFUSION  The various methods of treatment have been discussed with the patient and family. After consideration of risks, benefits and other options for treatment, the patient has consented to  Procedure(s): INSERTION PLEURAL DRAINAGE CATHETER (Right) as a surgical intervention .  The patient's history has been reviewed, patient examined, no change in status, stable for surgery.  I have reviewed the patient's chart and labs.  Questions were answered to the patient's satisfaction.     [name],[name]

## 2014-07-06 ENCOUNTER — Encounter (HOSPITAL_COMMUNITY): Payer: Self-pay | Admitting: Thoracic Surgery (Cardiothoracic Vascular Surgery)

## 2014-07-06 NOTE — Care Management Note (Signed)
    Page 1 of 1   07/05/2014     5:06:56 PM CARE MANAGEMENT NOTE 07/05/2014  Patient:  LOOMIS, ANACKER   Account Number:  [id number]  Date Initiated:  07/05/2014  Documentation initiated by:  [facility]  Subjective/Objective Assessment:   81 yo in ambulatory for placement of pleurx catheter.     Action/Plan:   Post-procedure order for [facility] to assist with daily drain- hopefully to start tomorrow 07/06/14 pending weather.   Anticipated DC Date:  07/05/2014   Anticipated DC Plan:  Real  CM consult      [facility] Choice  HOME HEALTH   Choice offered to / List presented to:  C-4 Adult Children        HH arranged  HH-1 RN      Door.   Status of service:  Completed, signed off Medicare Important Message given?  NA - LOS <3 / Initial given by admissions (If response is "NO", the following Medicare IM given date fields will be blank) Date Medicare IM given:   Medicare IM given by:   Date Additional Medicare IM given:   Additional Medicare IM given by:    Discharge Disposition:  [place]  Per UR Regulation:    If discussed at Long Length of Stay Meetings, dates discussed:    Comments:  01.21.16 [facility] RN BSN ACM [phone] referral to [place] at [place]. Faxed canister order/demos to [facility] at [phone] with confirmation received.

## 2014-07-06 NOTE — Op Note (Signed)
NAMESEMAJ, COBURN             ACCOUNT NO.:  [id number]  MEDICAL RECORD NO.:  [id number]  LOCATION:  [facility]                         FACILITY:  [place]  PHYSICIAN:  Revonda Standard. Roxan Hockey, M.D.DATE OF BIRTH:  1927-06-21  DATE OF PROCEDURE:  07/05/2014 DATE OF DISCHARGE:  07/05/2014                              OPERATIVE REPORT   PREOPERATIVE DIAGNOSIS:  Malignant right pleural effusion.  POSTOPERATIVE DIAGNOSIS:  Malignant right pleural effusion.  PROCEDURE:  Right pleural catheter placement  SURGEON:  Remo Lipps C. Roxan Hockey, M.D.  ASSISTANT:  None.  ANESTHESIA:  Local with intravenous sedation.  FINDINGS:  Effusion accessed easily on first attempt. Wire passed easily. Positioning confirmed with fluoroscopy. 1.75 L of serous fluid was evacuated.  The patient tolerated well.  CLINICAL NOTE:  [name] is an 81 year old gentleman with a recently diagnosed malignant right pleural effusion secondary to lung cancer.  He had symptomatic relief with thoracentesis but over a relatively short period of time had reaccumulated this effusion, and became symptomatic once again.  He was advised to have a pleural catheter placement for management of the effusion.  The indications, risks, benefits, and alternatives were discussed in detail with the patient.  He understood and accepted the risks and agreed to proceed.  OPERATIVE NOTE:  Mr. Fitzwater was brought to the operating room on July 05, 2014.  He was given intravenous sedation and monitored by Anesthesia.  Intravenous antibiotics were administered.  The right chest was prepped and draped in usual sterile fashion.  The site for catheter Entry, as well as the exit site from the skin, and the subcutaneous tissue between the 2 incisions were anesthetized with 1% lidocaine. 15 mL of local was utilized.  After ensuring adequate local anesthetic effect, an incision was made at the entry site in the lateral chest wall.  The pleural effusion  was accessed with a needle and a wire was passed into the pleural space.  Position was confirmed with fluoroscopy.  An incision then was made at the exit site and the catheter was tunneled from the exit to the entry site with the cuff just under the skin at the exit site.  The tract over the wire then was dilated and the peel-away sheath introducer was placed.  The inner cannula was removed.  The pleural catheter was advanced through the peel-away sheath introducer, which was then removed.  It was then hooked to suction.  The entry site incision was closed with a 3-0 Vicryl subcuticular suture and Dermabond was applied.  The catheter was secured at the skin with a 3-0 nylon suture. After a liter of fluid had drained, the catheter was placed to a second suction bottle and an additional 750 mL of fluid was evacuated for a total of 1.75 L.  The fluid was clear serous fluid.  It was not sent for studies as it was already confirmed to be a malignant effusion.  The catheter was capped and dressed in the standard fashion.  The patient then was taken from the operating room to the postanesthetic care unit in good condition.     Revonda Standard Roxan Hockey, M.D.     SCH/MEDQ  D:  07/05/2014  T:  07/06/2014  Job:  [id number]

## 2014-07-09 ENCOUNTER — Telehealth: Payer: Self-pay | Admitting: *Deleted

## 2014-07-09 NOTE — Telephone Encounter (Signed)
Angie, R.N. Called from  [facility] to relate that she was not able to drain Henry Hudson new pleurX catheter today because he went to [facility]. Today and will not be back until tomorrow.  He said he would call her when he returns.  This was an Pharmacist, hospital for our office.

## 2014-07-11 ENCOUNTER — Ambulatory Visit (HOSPITAL_COMMUNITY)
Admission: RE | Admit: 2014-07-11 | Discharge: 2014-07-11 | Disposition: A | Discharge status: Home / Self Care | Payer: Medicare Other | Source: Ambulatory Visit | Attending: Thoracic Surgery (Cardiothoracic Vascular Surgery) | Admitting: Thoracic Surgery (Cardiothoracic Vascular Surgery)

## 2014-07-11 DIAGNOSIS — J90 Pleural effusion, not elsewhere classified: Secondary | ICD-10-CM | POA: Diagnosis present

## 2014-07-11 DIAGNOSIS — C3490 Malignant neoplasm of unspecified part of unspecified bronchus or lung: Secondary | ICD-10-CM | POA: Diagnosis not present

## 2014-07-11 DIAGNOSIS — J910 Malignant pleural effusion: Secondary | ICD-10-CM | POA: Diagnosis not present

## 2014-07-11 LAB — GLUCOSE, CAPILLARY: Glucose-Capillary: 112 mg/dL — ABNORMAL HIGH (ref 70–99)

## 2014-07-11 MED ORDER — FLUDEOXYGLUCOSE F - 18 (FDG) INJECTION
8.5000 | Freq: Once | INTRAVENOUS | Status: AC | PRN
  Administered 2014-07-11: 8.5 via INTRAVENOUS

## 2014-07-18 ENCOUNTER — Ambulatory Visit (HOSPITAL_COMMUNITY)
Admission: RE | Admit: 2014-07-18 | Discharge: 2014-07-18 | Disposition: A | Discharge status: Home / Self Care | Payer: Medicare Other | Source: Ambulatory Visit | Attending: Thoracic Surgery (Cardiothoracic Vascular Surgery) | Admitting: Thoracic Surgery (Cardiothoracic Vascular Surgery)

## 2014-07-18 DIAGNOSIS — C3490 Malignant neoplasm of unspecified part of unspecified bronchus or lung: Secondary | ICD-10-CM | POA: Insufficient documentation

## 2014-07-18 DIAGNOSIS — Z87891 Personal history of nicotine dependence: Secondary | ICD-10-CM | POA: Diagnosis not present

## 2014-07-18 DIAGNOSIS — R51 Headache: Secondary | ICD-10-CM | POA: Diagnosis not present

## 2014-07-18 DIAGNOSIS — J90 Pleural effusion, not elsewhere classified: Secondary | ICD-10-CM

## 2014-07-18 MED ORDER — GADOBENATE DIMEGLUMINE 529 MG/ML IV SOLN
17.0000 mL | Freq: Once | INTRAVENOUS | Status: AC | PRN
  Administered 2014-07-18: 17 mL via INTRAVENOUS

## 2014-07-23 ENCOUNTER — Other Ambulatory Visit (HOSPITAL_COMMUNITY)
Admission: RE | Admit: 2014-07-23 | Discharge: 2014-07-23 | Disposition: A | Discharge status: Home / Self Care | Payer: Medicare Other | Source: Ambulatory Visit | Attending: Oncology | Admitting: Oncology

## 2014-07-23 DIAGNOSIS — C3490 Malignant neoplasm of unspecified part of unspecified bronchus or lung: Secondary | ICD-10-CM | POA: Diagnosis present

## 2014-07-24 ENCOUNTER — Ambulatory Visit (INDEPENDENT_AMBULATORY_CARE_PROVIDER_SITE_OTHER): Payer: Medicare Other | Admitting: Thoracic Surgery (Cardiothoracic Vascular Surgery)

## 2014-07-24 ENCOUNTER — Encounter: Payer: Self-pay | Admitting: Thoracic Surgery (Cardiothoracic Vascular Surgery)

## 2014-07-24 VITALS — BP 113/68 | HR 88 | Resp 20 | Ht 67.0 in | Wt 181.0 lb

## 2014-07-24 DIAGNOSIS — J910 Malignant pleural effusion: Secondary | ICD-10-CM

## 2014-07-24 DIAGNOSIS — C3491 Malignant neoplasm of unspecified part of right bronchus or lung: Secondary | ICD-10-CM

## 2014-07-24 NOTE — Progress Notes (Signed)
HPI:  [name] returns today for follow-up after placement of Pleurx catheter and having a PET CT and MRI done.   He saw Dr. Tressie Stalker yesterday to discuss chemotherapy.  He initially was draining 750 date 100 mL every other day. We changed him to daily drainage and he has been draining 200-300 mL per day since then. He is not having any pain with the drainage.  Past Medical History  Diagnosis Date  . Chest pain, unspecified     2 Vessel Coronary artery Disease  . Intermediate coronary syndrome   . Unspecified hypertensive kidney disease with chronic kidney disease stage I through stage IV, or unspecified   . Other and unspecified hyperlipidemia   . Hypopotassemia   . Other specified cardiac dysrhythmias(427.89)   . Chronic kidney disease, unspecified   . Personal history of tobacco use, presenting hazards to health   . Hypertension   . Coronary artery disease 2009  . Arthritis   . Cancer     lung cancer   . BPH (benign prostatic hypertrophy)     Takes Flomax  . Head ache     very rare     Current Outpatient Prescriptions  Medication Sig Dispense Refill  . amLODipine (NORVASC) 5 MG tablet Take 5 mg by mouth daily.     Marland Kitchen aspirin EC 81 MG tablet Take 81 mg by mouth 2 (two) times daily.    . carvedilol (COREG) 12.5 MG tablet Take 12.5 mg by mouth 2 (two) times daily.  2  . HYDROcodone-acetaminophen (NORCO) 5-325 MG per tablet Take 1-2 tablets by mouth every 6 (six) hours as needed for moderate pain. 40 tablet 0  . pantoprazole (PROTONIX) 40 MG tablet Take 1 tablet (40 mg total) by mouth 2 (two) times daily before a meal. Take 30- 60 min before your first and last meals of the day 60 tablet 2  . tamsulosin (FLOMAX) 0.4 MG CAPS capsule Take 0.4 mg by mouth every evening.      No current facility-administered medications for this visit.    Physical Exam BP 113/68 mmHg  Pulse 88  Resp 20  Ht 5\' 7"  (1.702 m)  Wt 181 lb (82.101 kg)  BMI 28.34 kg/m2  SpO41 76% 81 year old  man in no acute distress Slightly diminished breath sounds right base, otherwise clear Pleurx catheter in place no signs of infection  Diagnostic Tests: MRI and PET/CT reviewed  MR showed no intracranial metastases  PET consistent with stage IV recurrent non-small cell lung cancer with involvement of the right pleura, mediastinal nodes, and left hip.  Impression: 81 year old gentleman who underwent a right upper middle lobectomy in May 2015 for what the time was a stage IB non-small cell cancer. He had an early recurrence now has stage IV disease with involvement of the parietal pleura and also a hip metastasis. He has been seen by Dr. Tressie Stalker and they're awaiting results of genetic testing before deciding on a chemotherapy regimen.  His Pleurx catheter is draining about 250 mL a day. At this point I think he can go to every other day drainage. I did tell his daughter that if he drinks more than 500 mL at a time, then she should go back to daily drainage.  They know to call if the catheter malfunctions or if he develops redness or increasing tenderness or swelling around the catheter.  Plan: I will see him back in 3 weeks with a PA and lateral chest x-ray to check on his progress  Revonda Standard Roxan Hockey, MD

## 2014-07-30 ENCOUNTER — Encounter (HOSPITAL_COMMUNITY): Payer: Self-pay

## 2014-07-31 ENCOUNTER — Ambulatory Visit: Payer: Self-pay | Admitting: Thoracic Surgery (Cardiothoracic Vascular Surgery)

## 2014-07-31 ENCOUNTER — Other Ambulatory Visit: Payer: Self-pay

## 2014-07-31 DIAGNOSIS — C3490 Malignant neoplasm of unspecified part of unspecified bronchus or lung: Secondary | ICD-10-CM

## 2014-08-10 ENCOUNTER — Other Ambulatory Visit: Payer: Self-pay | Admitting: Thoracic Surgery (Cardiothoracic Vascular Surgery)

## 2014-08-10 DIAGNOSIS — C3490 Malignant neoplasm of unspecified part of unspecified bronchus or lung: Secondary | ICD-10-CM

## 2014-08-14 ENCOUNTER — Ambulatory Visit (INDEPENDENT_AMBULATORY_CARE_PROVIDER_SITE_OTHER): Payer: Medicare Other | Admitting: Thoracic Surgery (Cardiothoracic Vascular Surgery)

## 2014-08-14 ENCOUNTER — Ambulatory Visit
Admission: RE | Admit: 2014-08-14 | Discharge: 2014-08-14 | Disposition: A | Discharge status: Home / Self Care | Payer: Medicare Other | Source: Ambulatory Visit | Attending: Thoracic Surgery (Cardiothoracic Vascular Surgery) | Admitting: Thoracic Surgery (Cardiothoracic Vascular Surgery)

## 2014-08-14 ENCOUNTER — Encounter: Payer: Self-pay | Admitting: Thoracic Surgery (Cardiothoracic Vascular Surgery)

## 2014-08-14 VITALS — BP 116/70 | HR 82 | Resp 20 | Ht 67.0 in | Wt 181.0 lb

## 2014-08-14 DIAGNOSIS — Z9889 Other specified postprocedural states: Secondary | ICD-10-CM

## 2014-08-14 DIAGNOSIS — C3490 Malignant neoplasm of unspecified part of unspecified bronchus or lung: Secondary | ICD-10-CM

## 2014-08-14 DIAGNOSIS — C3491 Malignant neoplasm of unspecified part of right bronchus or lung: Secondary | ICD-10-CM

## 2014-08-14 DIAGNOSIS — Z902 Acquired absence of lung [part of]: Secondary | ICD-10-CM

## 2014-08-14 DIAGNOSIS — J910 Malignant pleural effusion: Secondary | ICD-10-CM

## 2014-08-14 NOTE — Progress Notes (Signed)
HPI:  Henry Hudson is an 81 year old gentleman who underwent a right upper middle lobectomy in May 2015 for what the time was a stage IB non-small cell cancer. He had an early recurrence now has stage IV disease with involvement of the parietal pleura and also a hip metastasis.   He has been seen by Henry Hudson. His genetic testing did not make him a candidate for an oral agent and he has opted not to do chemotherapy.  He does not have shortness of breath with walking on level ground but if he goes up an incline or stairs. He is not having any hip pain. He had a MR brain recently which showed no metastatic disease.  He is currently draining the catheter every other day. He drains 250- 400 ml at a time.  Past Medical History  Diagnosis Date  . Chest pain, unspecified     2 Vessel Coronary artery Disease  . Intermediate coronary syndrome   . Unspecified hypertensive kidney disease with chronic kidney disease stage I through stage IV, or unspecified   . Other and unspecified hyperlipidemia   . Hypopotassemia   . Other specified cardiac dysrhythmias(427.89)   . Chronic kidney disease, unspecified   . Personal history of tobacco use, presenting hazards to health   . Hypertension   . Coronary artery disease 2009  . Arthritis   . Cancer     lung cancer   . BPH (benign prostatic hypertrophy)     Takes Flomax  . Head ache     very rare      Current Outpatient Prescriptions  Medication Sig Dispense Refill  . amLODipine (NORVASC) 5 MG tablet Take 5 mg by mouth daily.     Marland Kitchen aspirin EC 81 MG tablet Take 81 mg by mouth 2 (two) times daily.    . carvedilol (COREG) 12.5 MG tablet Take 12.5 mg by mouth 2 (two) times daily.  2  . HYDROcodone-acetaminophen (NORCO) 5-325 MG per tablet Take 1-2 tablets by mouth every 6 (six) hours as needed for moderate pain. 40 tablet 0  . pantoprazole (PROTONIX) 40 MG tablet Take 1 tablet (40 mg total) by mouth 2 (two) times daily before a meal. Take 30-  60 min before your first and last meals of the day 60 tablet 2  . tamsulosin (FLOMAX) 0.4 MG CAPS capsule Take 0.4 mg by mouth every evening.      No current facility-administered medications for this visit.    Physical Exam BP 116/70 mmHg  Pulse 82  Resp 20  Ht 5\' 7"  (1.702 m)  Wt 181 lb (82.101 kg)  BMI 28.34 kg/m2  SpO64 22% 81 year old man in no acute distress Pleurx catheter undressed Slightly diminished breath sounds right base, otherwise clear Cardiac regular rate and rhythm normal S1 and S2  Diagnostic Tests: Chest x-ray 08/14/2014 shows a small right pleural effusion with pleural catheter in place. There is some interstitial changes in the right lung. Radiologist felt this could represent an infiltrate. I suspect it might be lymphangitic spread of tumor.  Impression: 81 year old gentleman with a malignant right pleural effusion due to early recurrent lung cancer. He was not a candidate for newer oral agents and has refused to do conventional chemotherapy. His wife died while receiving chemotherapy and he feels that it was due to the treatment.  At this point think which continue with every other day drainage. I would like to see it taper off more before trying to go to every  third day. We did discuss potential for talc pleurodesis via the tube. He is not anxious to do that.   Plan: Continue every other day pleural catheter drainage  I'll see him back in 4 weeks with a chest x-ray to check on his progress.  He knows to call in the interim if he has any issues related to the catheter  Remo Lipps C. Roxan Hockey, MD [facility] [phone]

## 2014-09-04 ENCOUNTER — Ambulatory Visit (INDEPENDENT_AMBULATORY_CARE_PROVIDER_SITE_OTHER): Payer: Medicare Other

## 2014-09-04 ENCOUNTER — Ambulatory Visit
Admission: RE | Admit: 2014-09-04 | Discharge: 2014-09-04 | Disposition: A | Discharge status: Home / Self Care | Payer: Medicare Other | Source: Ambulatory Visit | Attending: Thoracic Surgery (Cardiothoracic Vascular Surgery) | Admitting: Thoracic Surgery (Cardiothoracic Vascular Surgery)

## 2014-09-04 VITALS — BP 119/68 | HR 79 | Resp 20

## 2014-09-04 DIAGNOSIS — C3490 Malignant neoplasm of unspecified part of unspecified bronchus or lung: Secondary | ICD-10-CM | POA: Diagnosis not present

## 2014-09-04 DIAGNOSIS — J90 Pleural effusion, not elsewhere classified: Secondary | ICD-10-CM

## 2014-09-04 NOTE — Progress Notes (Signed)
Today the drainage amount was 125 cc's. Patient tolerated well. No signs of infection. The fluid amounts have decreased over the past week. Patient will have a CXR on the way out and will call him with the results later today. The patient and daughter are very anxious to have the pleurX removed.  Will send the information to Dr Roxan Hockey to review.   09/03/14- 100 cc's 09/01/14- 225 cc's 08/30/14- 300 cc's 08/28/14 -350 cc's 08/26/14- 350 cc's 08/24/14/- 350 cc's 08/22/14 - 350 cc's 08/20/14 -350 ss' 08/18/14 - 400 cc's 08/16/14 - 400 cc's

## 2014-09-05 ENCOUNTER — Other Ambulatory Visit: Payer: Self-pay | Admitting: Thoracic Surgery (Cardiothoracic Vascular Surgery)

## 2014-09-05 DIAGNOSIS — C3490 Malignant neoplasm of unspecified part of unspecified bronchus or lung: Secondary | ICD-10-CM

## 2014-09-11 ENCOUNTER — Ambulatory Visit: Payer: Medicare Other | Admitting: Thoracic Surgery (Cardiothoracic Vascular Surgery)

## 2014-09-14 ENCOUNTER — Ambulatory Visit (INDEPENDENT_AMBULATORY_CARE_PROVIDER_SITE_OTHER): Payer: Medicare Other | Admitting: Thoracic Surgery (Cardiothoracic Vascular Surgery)

## 2014-09-14 ENCOUNTER — Ambulatory Visit
Admission: RE | Admit: 2014-09-14 | Discharge: 2014-09-14 | Disposition: A | Discharge status: Home / Self Care | Payer: Medicare Other | Source: Ambulatory Visit | Attending: Thoracic Surgery (Cardiothoracic Vascular Surgery) | Admitting: Thoracic Surgery (Cardiothoracic Vascular Surgery)

## 2014-09-14 ENCOUNTER — Other Ambulatory Visit: Payer: Self-pay | Admitting: *Deleted

## 2014-09-14 ENCOUNTER — Encounter: Payer: Self-pay | Admitting: Thoracic Surgery (Cardiothoracic Vascular Surgery)

## 2014-09-14 VITALS — BP 140/74 | HR 88 | Resp 20 | Ht 67.0 in | Wt 170.0 lb

## 2014-09-14 DIAGNOSIS — Z9889 Other specified postprocedural states: Secondary | ICD-10-CM | POA: Diagnosis not present

## 2014-09-14 DIAGNOSIS — Z902 Acquired absence of lung [part of]: Secondary | ICD-10-CM

## 2014-09-14 DIAGNOSIS — J90 Pleural effusion, not elsewhere classified: Secondary | ICD-10-CM

## 2014-09-14 DIAGNOSIS — C3491 Malignant neoplasm of unspecified part of right bronchus or lung: Secondary | ICD-10-CM | POA: Diagnosis not present

## 2014-09-14 DIAGNOSIS — J910 Malignant pleural effusion: Secondary | ICD-10-CM

## 2014-09-14 DIAGNOSIS — C3490 Malignant neoplasm of unspecified part of unspecified bronchus or lung: Secondary | ICD-10-CM

## 2014-09-14 MED ORDER — IPRATROPIUM-ALBUTEROL 18-103 MCG/ACT IN AERO: 2.0000 | INHALATION_SPRAY | Freq: Four times a day (QID) | RESPIRATORY_TRACT | Status: DC | PRN

## 2014-09-14 MED ORDER — ALBUTEROL SULFATE HFA 108 (90 BASE) MCG/ACT IN AERS: 2.0000 | INHALATION_SPRAY | Freq: Four times a day (QID) | RESPIRATORY_TRACT | Status: AC | PRN

## 2014-09-14 NOTE — Progress Notes (Signed)
[address]       [place] [address]             [phone]       HPI:  Henry Hudson returns for follow-up of his malignant right pleural effusion and Pleurx catheter.   Henry Hudson is an 81 year old gentleman who underwent a right upper middle lobectomy in May 2015 for what the time was a stage IB non-small cell cancer. He had an early recurrence now has stage IV disease with involvement of the parietal pleura and also a hip metastasis.   He has been seen by Dr. Tressie Stalker.  He has opted not to do chemotherapy.  He was last in the office on March 1. At that time he was draining about 250-400 mL every other day. That has steadily decreased over the past couple of weeks and now is draining between 100-200 mL every other day.  He says that he feels more short of breath and is having to use the oxygen more.  Past Medical History  Diagnosis Date  . Chest pain, unspecified     2 Vessel Coronary artery Disease  . Intermediate coronary syndrome   . Unspecified hypertensive kidney disease with chronic kidney disease stage I through stage IV, or unspecified   . Other and unspecified hyperlipidemia   . Hypopotassemia   . Other specified cardiac dysrhythmias(427.89)   . Chronic kidney disease, unspecified   . Personal history of tobacco use, presenting hazards to health   . Hypertension   . Coronary artery disease 2009  . Arthritis   . Cancer     lung cancer   . BPH (benign prostatic hypertrophy)     Takes Flomax  . Head ache     very rare      Current Outpatient Prescriptions  Medication Sig Dispense Refill  . amLODipine (NORVASC) 5 MG tablet Take 5 mg by mouth daily.     Marland Kitchen aspirin EC 81 MG tablet Take 81 mg by mouth 2 (two) times daily.    . carvedilol (COREG) 12.5 MG tablet Take 12.5 mg by mouth 2 (two) times daily.  2  . HYDROcodone-acetaminophen (NORCO) 5-325 MG per tablet Take 1-2 tablets by mouth every 6 (six) hours as needed for moderate pain. 40  tablet 0  . pantoprazole (PROTONIX) 40 MG tablet Take 1 tablet (40 mg total) by mouth 2 (two) times daily before a meal. Take 30- 60 min before your first and last meals of the day 60 tablet 2  . tamsulosin (FLOMAX) 0.4 MG CAPS capsule Take 0.4 mg by mouth every evening.     Marland Kitchen albuterol (PROVENTIL HFA;VENTOLIN HFA) 108 (90 BASE) MCG/ACT inhaler Inhale 2 puffs into the lungs every 6 (six) hours as needed for wheezing or shortness of breath. 1 Inhaler 2   No current facility-administered medications for this visit.    Physical Exam BP 140/74 mmHg  Pulse 88  Resp 20  Ht 5\' 7"  (1.702 m)  Wt 170 lb (77.111 kg)  BMI 26.62 kg/m2  SpO2 94% Chronically ill-appearing 81 year old man in no acute distress Alert and oriented 3 Diminished breath sounds right base, wheezing bilaterally  Diagnostic Tests: Chest x-ray was personally reviewed and shows some loculated pleural fluid on the right side. There is a more prominent interstitial pattern in the right lung that on his previous x-ray.  Impression: Henry Hudson an 81 year old gentleman with metastatic Stage IV non-small cell lung cancer. He has a malignant  right pleural effusion. We have been managing that with a Pleurx catheter. He has had decrease in the amount of drainage over the past couple of weeks. Unfortunately his chest x-ray today (done pre-drainage) shows some loculated fluid laterally. I'm concerned that this is not being drained by the Pleurx catheter. I'm also concerned he may have lymphangitic spread in the right lung.  We drained this catheter in the office and there was approximately 150 mL of fluid drained. He tolerated this well.  We'll send him back for another chest x-ray to see if that loculated fluid was in fact drained, although I am not optimistic.  I do think he is probably going to need to use his oxygen more frequently.  I am going to give him a prescription for an albuterol inhaler for his wheezing see if that helps at  all.  Plan: Albuterol MDI 2 puffs 4 times a day when necessary  Use oxygen as needed  Repeat chest x-ray today  Changed to twice weekly pleural catheter drainage  Return in 2 weeks with repeat chest x-ray  Henry Nakayama, MD [facility] [phone]  Addendum  His repeat chest x-ray shows little improvement postdrainage.  I will plan to see him back in 2 weeks  Remo Lipps C. Roxan Hockey, MD [facility] [phone]

## 2014-09-28 ENCOUNTER — Other Ambulatory Visit: Payer: Self-pay | Admitting: Thoracic Surgery (Cardiothoracic Vascular Surgery)

## 2014-09-28 DIAGNOSIS — C3490 Malignant neoplasm of unspecified part of unspecified bronchus or lung: Secondary | ICD-10-CM

## 2014-10-02 ENCOUNTER — Encounter: Payer: Self-pay | Admitting: Thoracic Surgery (Cardiothoracic Vascular Surgery)

## 2014-10-02 ENCOUNTER — Ambulatory Visit
Admission: RE | Admit: 2014-10-02 | Discharge: 2014-10-02 | Disposition: A | Discharge status: Home / Self Care | Payer: Medicare Other | Source: Ambulatory Visit | Attending: Thoracic Surgery (Cardiothoracic Vascular Surgery) | Admitting: Thoracic Surgery (Cardiothoracic Vascular Surgery)

## 2014-10-02 ENCOUNTER — Ambulatory Visit (INDEPENDENT_AMBULATORY_CARE_PROVIDER_SITE_OTHER): Payer: Medicare Other | Admitting: Thoracic Surgery (Cardiothoracic Vascular Surgery)

## 2014-10-02 VITALS — BP 99/53 | HR 84 | Resp 20 | Ht 67.0 in | Wt 172.0 lb

## 2014-10-02 DIAGNOSIS — Z902 Acquired absence of lung [part of]: Secondary | ICD-10-CM

## 2014-10-02 DIAGNOSIS — Z9889 Other specified postprocedural states: Secondary | ICD-10-CM

## 2014-10-02 DIAGNOSIS — C3491 Malignant neoplasm of unspecified part of right bronchus or lung: Secondary | ICD-10-CM

## 2014-10-02 DIAGNOSIS — C3490 Malignant neoplasm of unspecified part of unspecified bronchus or lung: Secondary | ICD-10-CM

## 2014-10-02 DIAGNOSIS — J910 Malignant pleural effusion: Secondary | ICD-10-CM | POA: Diagnosis not present

## 2014-10-02 NOTE — Progress Notes (Signed)
HPI:   Henry Hudson returns for follow-up of his malignant right pleural effusion and Pleurx catheter.  Henry Hudson is an 81 year old gentleman who underwent a right upper middle lobectomy in May 2015 for what the time was a stage IB non-small cell cancer. He had an early recurrence now has stage IV disease with involvement of the parietal pleura and also a hip metastasis.   He has been seen by Dr. Tressie Stalker. He has opted not to do chemotherapy.  He had noted a marked decrease in his drainage from his pleural catheter. He has not drained in the past 2 weeks. His breathing is about the same. He was started on some antibiotics and he thinks has helped a little bit. He also is using nebulizers which he thinks helps. His daughter is concerned that he has overexerting himself working in the yard and on [employer].  Past Medical History  Diagnosis Date  . Chest pain, unspecified     2 Vessel Coronary artery Disease  . Intermediate coronary syndrome   . Unspecified hypertensive kidney disease with chronic kidney disease stage I through stage IV, or unspecified   . Other and unspecified hyperlipidemia   . Hypopotassemia   . Other specified cardiac dysrhythmias(427.89)   . Chronic kidney disease, unspecified   . Personal history of tobacco use, presenting hazards to health   . Hypertension   . Coronary artery disease 2009  . Arthritis   . Cancer     lung cancer   . BPH (benign prostatic hypertrophy)     Takes Flomax  . Head ache     very rare     Current Outpatient Prescriptions  Medication Sig Dispense Refill  . albuterol (PROVENTIL HFA;VENTOLIN HFA) 108 (90 BASE) MCG/ACT inhaler Inhale 2 puffs into the lungs every 6 (six) hours as needed for wheezing or shortness of breath. 1 Inhaler 2  . amLODipine (NORVASC) 5 MG tablet Take 5 mg by mouth daily.     Marland Kitchen aspirin EC 81 MG tablet Take 81 mg by mouth 2 (two) times daily.    . carvedilol (COREG) 12.5 MG tablet Take 12.5 mg by mouth 2  (two) times daily.  2  . cefUROXime (CEFTIN) 250 MG tablet Take 250 mg by mouth 2 (two) times daily with a meal.   0  . clarithromycin (BIAXIN) 500 MG tablet Take 500 mg by mouth 2 (two) times daily.   0  . famotidine (PEPCID) 20 MG tablet Take 20 mg by mouth daily.   2  . HYDROcodone-acetaminophen (NORCO) 5-325 MG per tablet Take 1-2 tablets by mouth every 6 (six) hours as needed for moderate pain. 40 tablet 0  . ipratropium-albuterol (DUONEB) 0.5-2.5 (3) MG/3ML SOLN Take 3 mLs by nebulization every 4 (four) hours as needed.    . pantoprazole (PROTONIX) 40 MG tablet Take 1 tablet (40 mg total) by mouth 2 (two) times daily before a meal. Take 30- 60 min before your first and last meals of the day 60 tablet 2  . tamsulosin (FLOMAX) 0.4 MG CAPS capsule Take 0.4 mg by mouth every evening.      No current facility-administered medications for this visit.    Physical Exam BP 99/53 mmHg  Pulse 84  Resp 20  Ht '5\' 7"'$  (1.702 m)  Wt 172 lb (78.019 kg)  BMI 26.93 kg/m2  SpO25 39% 81 year old man in no acute distress Pleurx catheter in place no sign of infection Diminished breath sounds right base  Diagnostic Tests: Chest  x-ray is essentially unchanged from 2 weeks ago. There is some loculated pleural fluid inferiorly and laterally. There is lymphangitic tumor throughout the right lung.   His Pleurx catheter was hooked to suction. There was essentially no output.  Impression: Nonfunctioning pleural catheter. Given that his symptoms are relatively stable and his fluid is loculated, I think we should go ahead and remove the pleural catheter today. It is no longer draining and only represents potential source for infection at this point.   Procedure The area around the exit site of pleural catheter was sterilized with alcohol. 4 mL of 1% lidocaine was used to achieve local anesthesia. The cuff was loosened from the surrounding tissue and the catheter was removed intact without difficulty. Patient  tolerated the procedure well.   Plan: I recommended that he uses oxygen when he is outside working  I will see him back in a month with a repeat chest x-ray.  I did discuss with his daughter that he has disease progression is unlikely to make significant symptomatic improvement. Hopefully they will focus on quality-of-life. There is no question she only has his best interest at heart.  Melrose Nakayama, MD [facility] [phone]

## 2014-10-26 ENCOUNTER — Telehealth: Payer: Self-pay | Admitting: *Deleted

## 2014-10-30 ENCOUNTER — Ambulatory Visit: Payer: Medicare Other | Admitting: Thoracic Surgery (Cardiothoracic Vascular Surgery)

## 2014-11-26 ENCOUNTER — Other Ambulatory Visit: Payer: Self-pay | Admitting: *Deleted

## 2014-11-26 MED ORDER — AMLODIPINE BESYLATE 5 MG PO TABS: 5.0000 mg | ORAL_TABLET | Freq: Every day | ORAL | Status: AC

## 2015-01-14 DEATH — deceased

## 2016-04-11 IMAGING — CR DG CHEST 2V
2 series · 2 of 2 positions shown · non-contrast
Comparison: 05/17/2014 and 01/23/2014

CLINICAL DATA: Productive cough. Previous right upper and middle
lobectomy for lung carcinoma.

EXAM:
CHEST  2 VIEW

[view not recorded (1 of 2)]
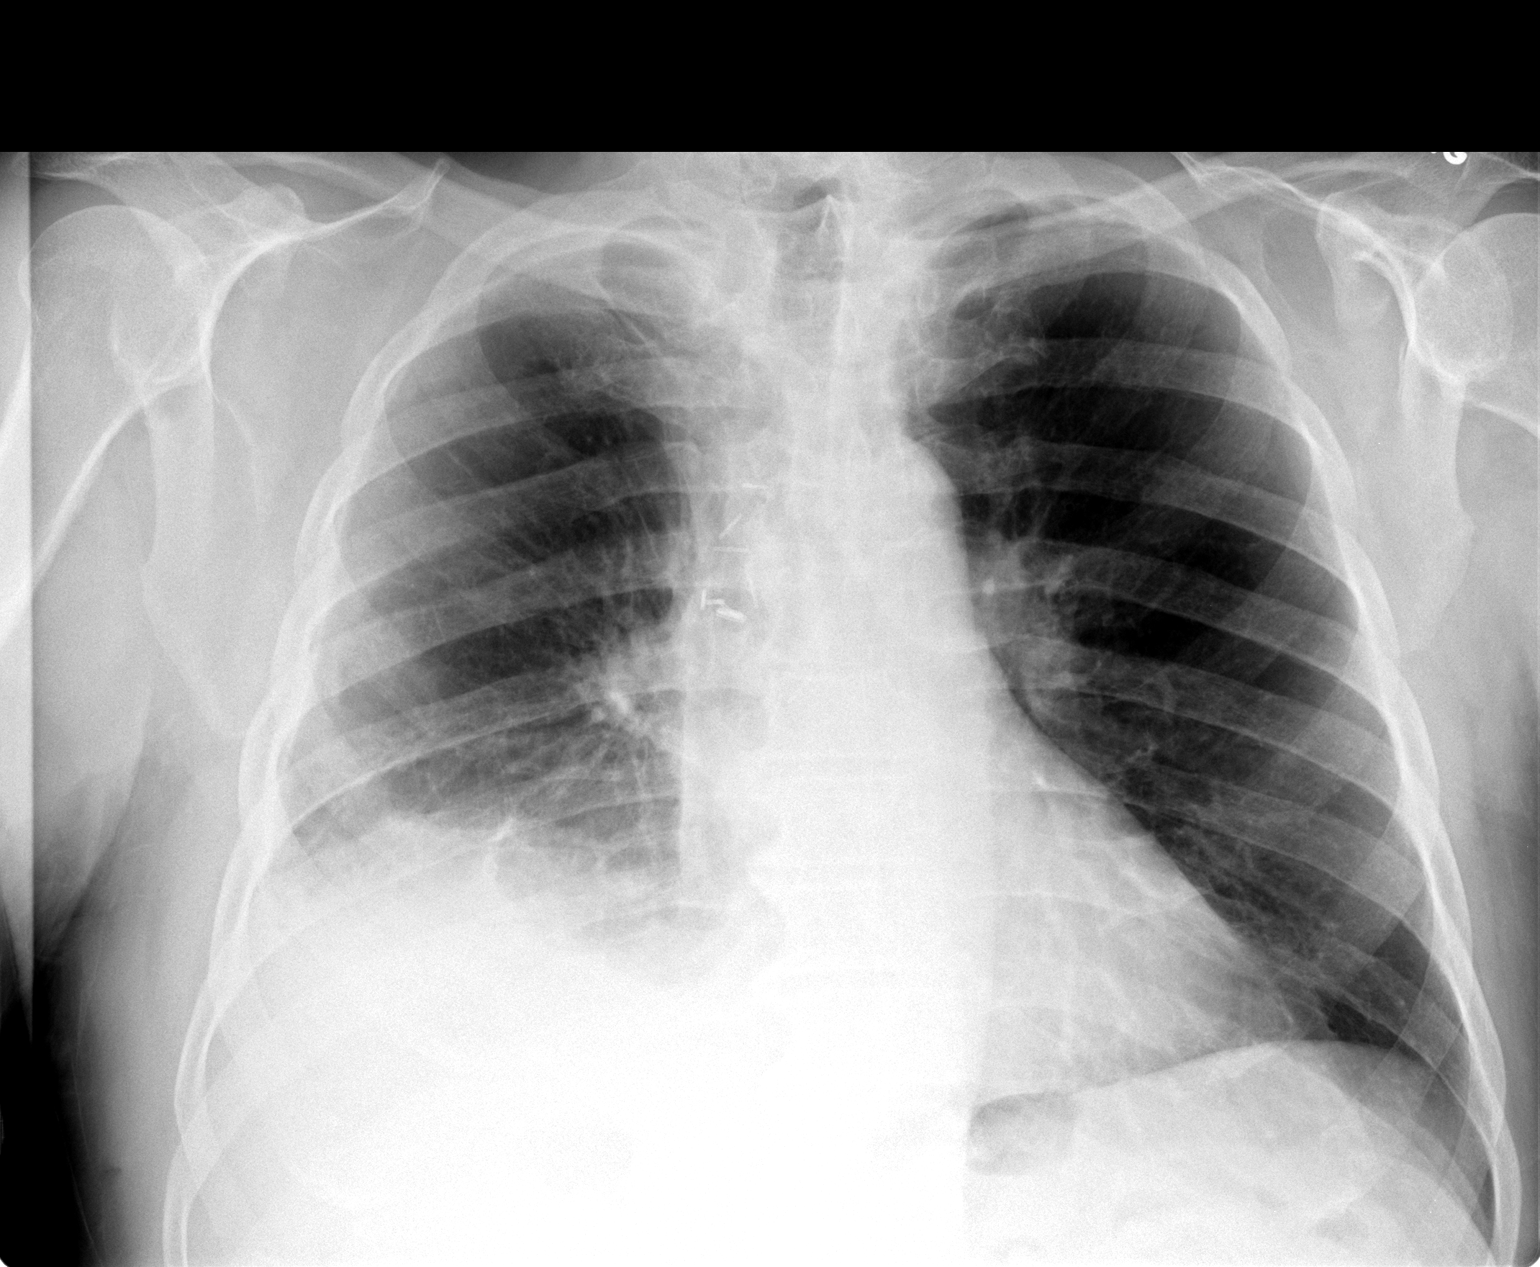

[view not recorded (2 of 2)]
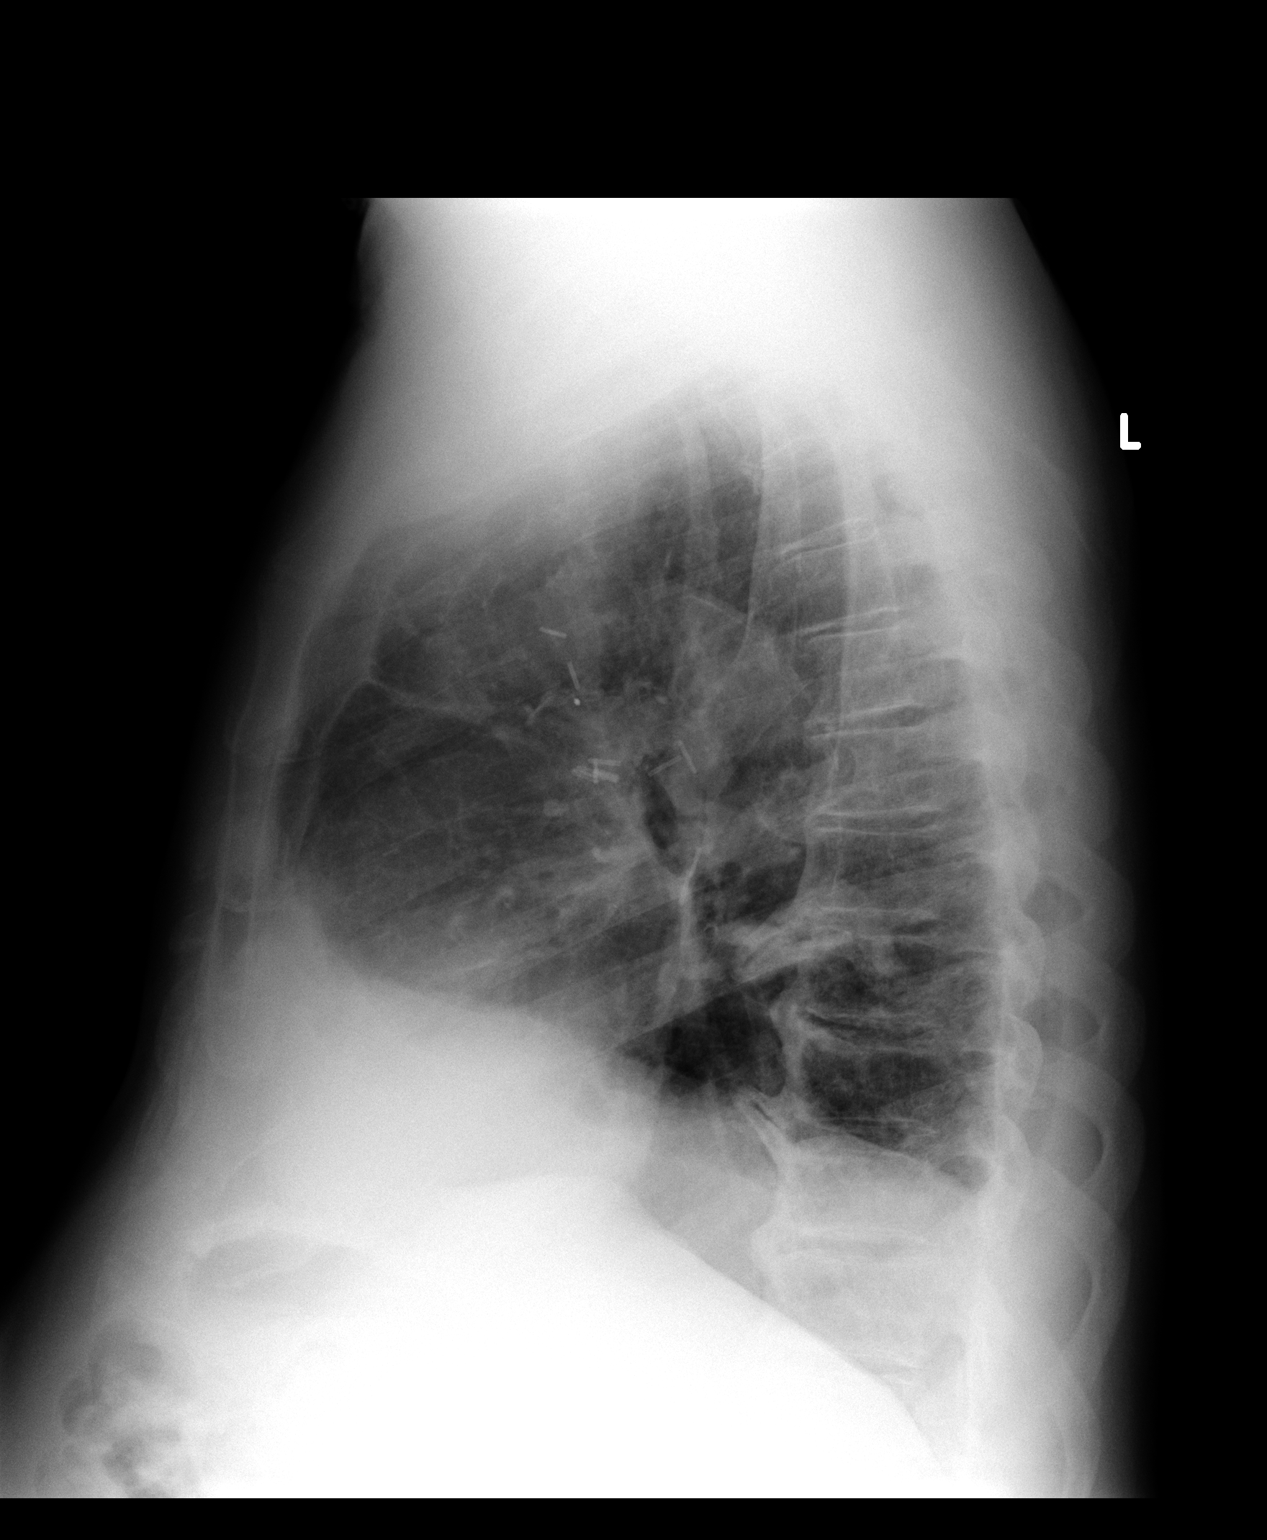

[2 of 2 positions shown; findings below may reference images not displayed]

FINDINGS: Postsurgical changes with elevation of right hemidiaphragm and right
basilar scarring show no significant change compared to prior exam.
No evidence of acute infiltrate or edema. No evidence of pleural
effusion. Heart size and mediastinal contours are stable. Surgical
clips again seen in right suprahilar region.
IMPRESSION: Stable postsurgical changes in right hemithorax.  No acute findings.

## 2016-04-20 IMAGING — CR DG CHEST 2V
2 series · 2 of 2 positions shown · non-contrast
Comparison: PA and lateral chest 05/31/2014 and 05/17/2014. CT
chest 03/23/2014.

CLINICAL DATA: Shortness of breath.

EXAM:
CHEST  2 VIEW

[view not recorded (1 of 2)]
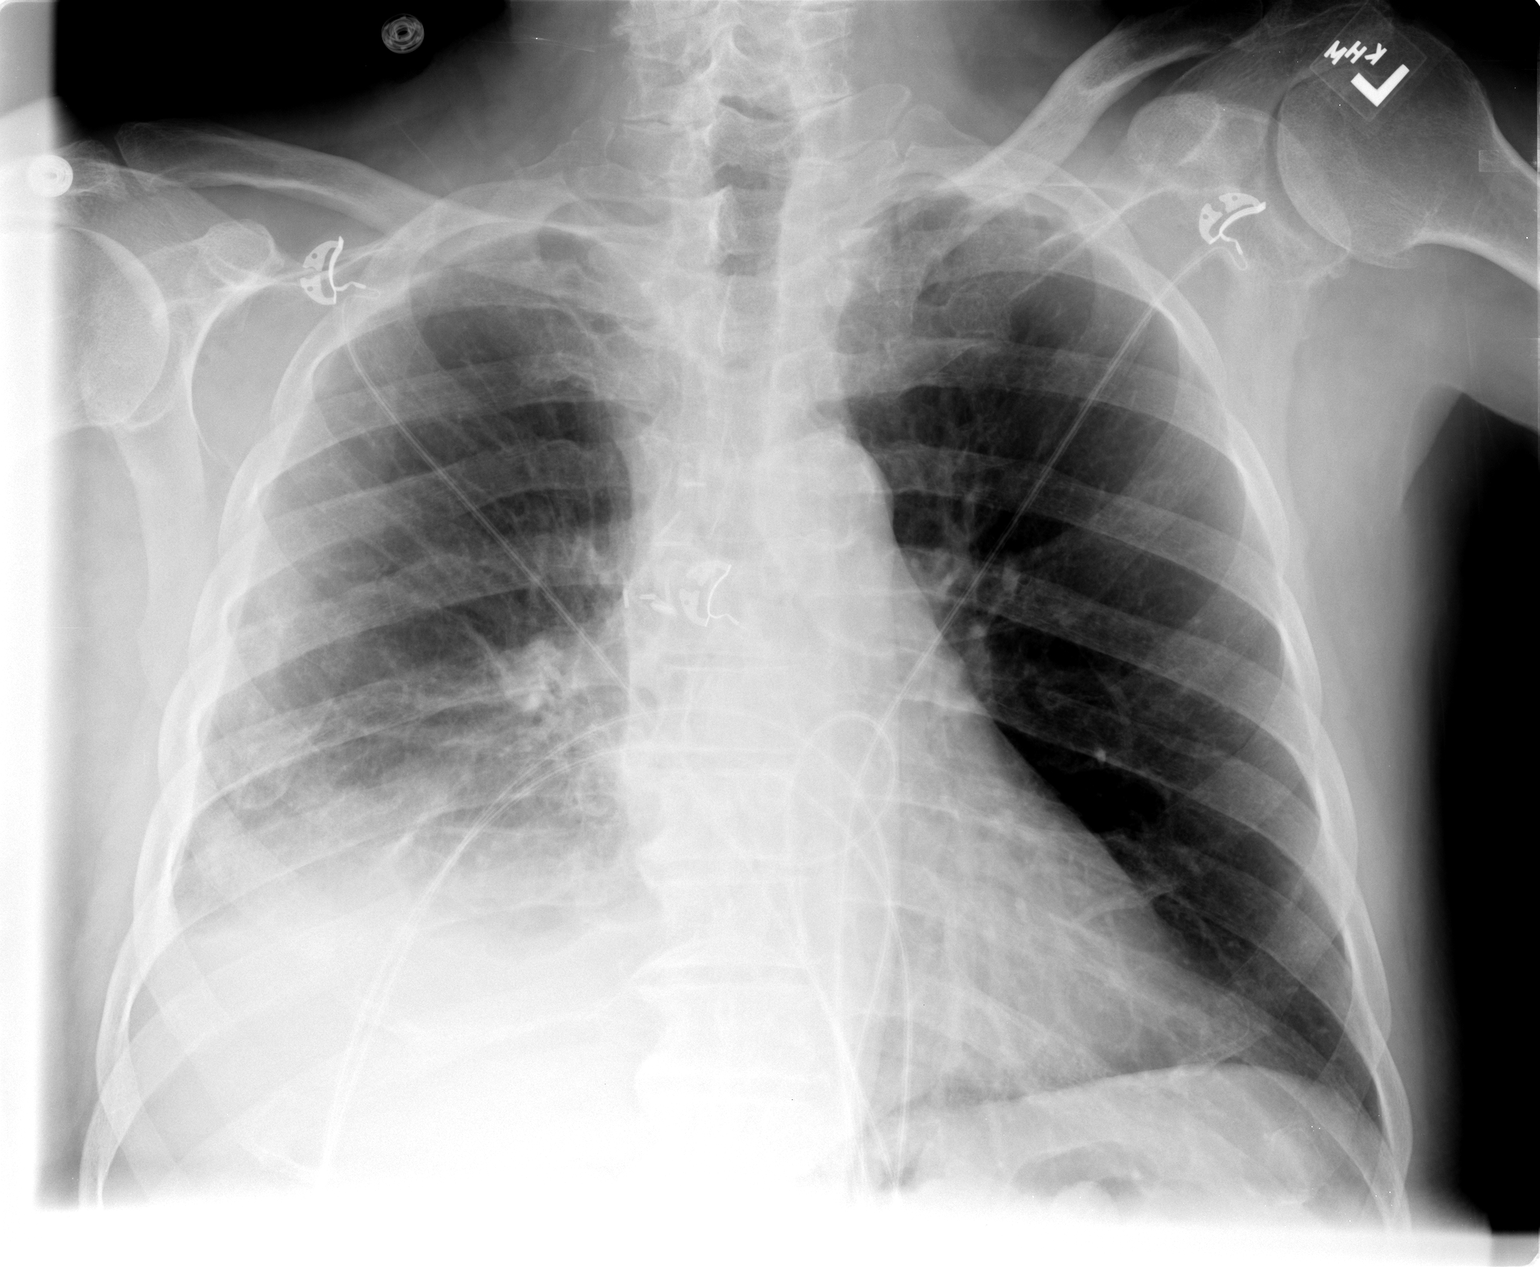

[view not recorded (2 of 2)]
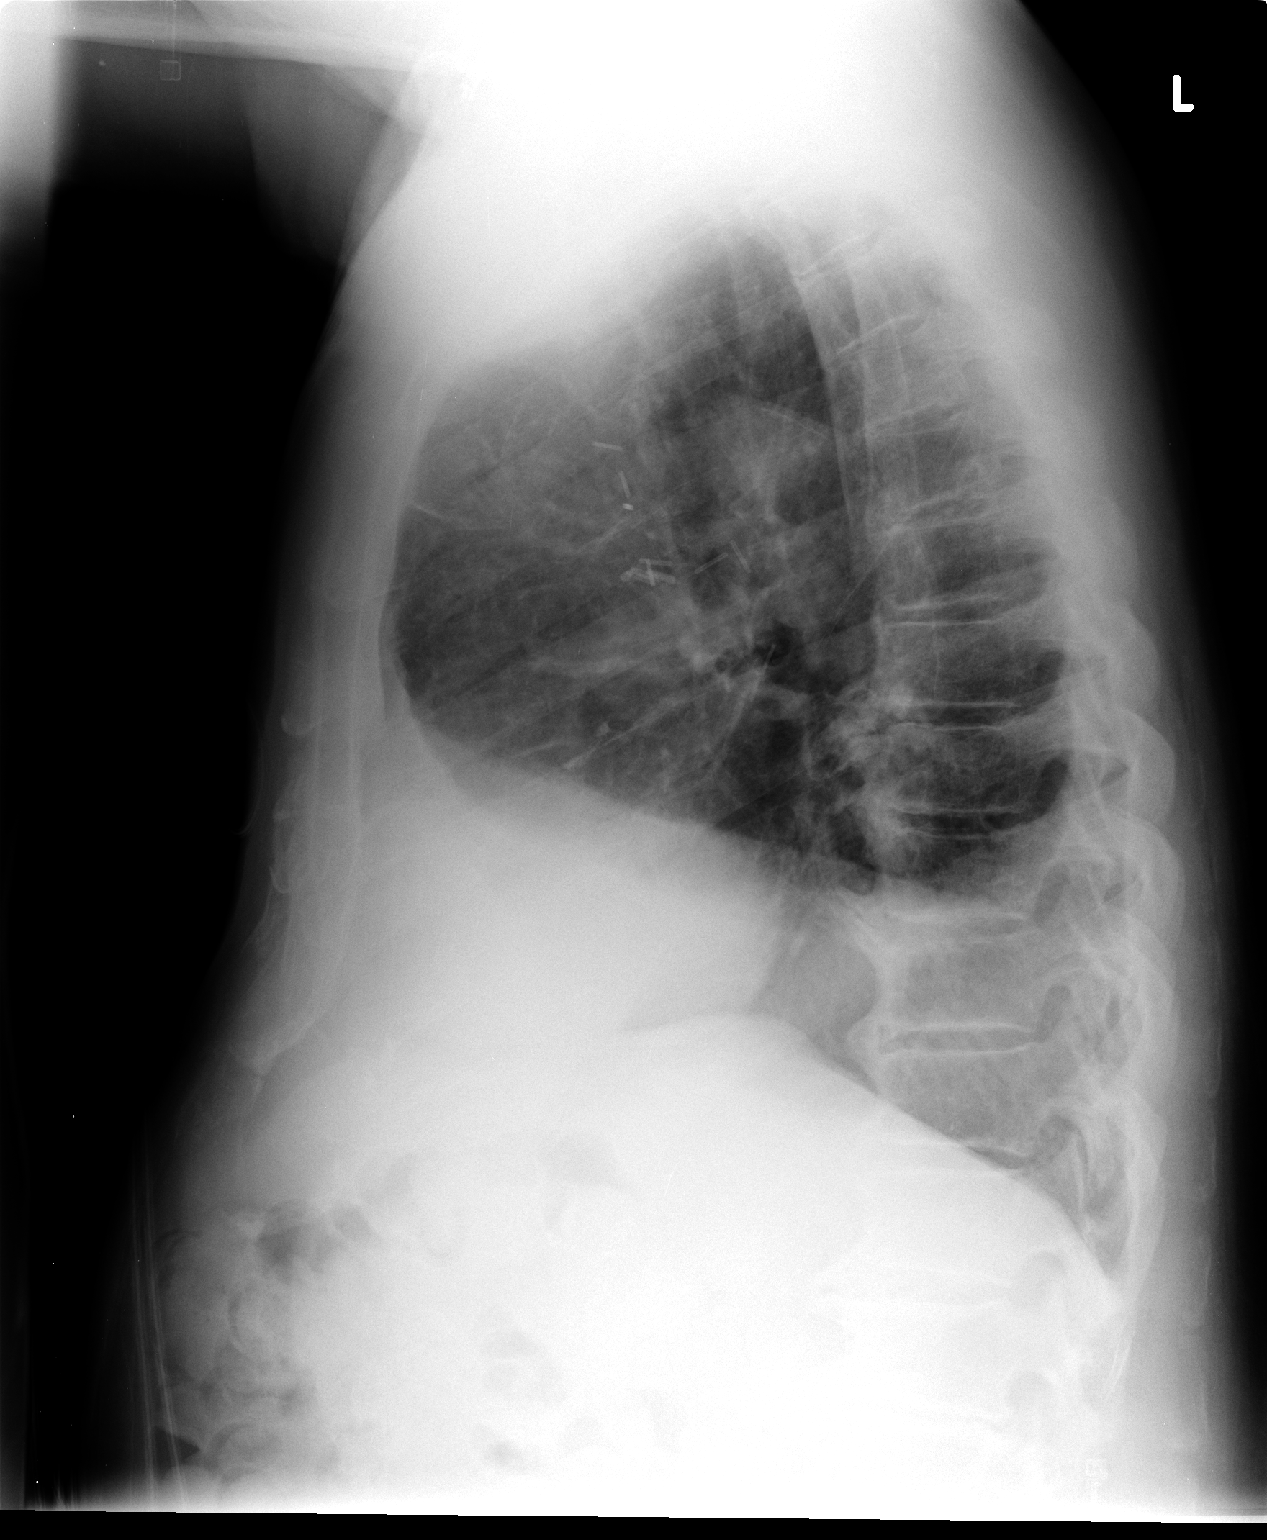

[2 of 2 positions shown; findings below may reference images not displayed]

FINDINGS: Right basilar airspace disease and pleural effusion are again seen
and have worsened. The left lung is clear. Heart size is normal. No
pneumothorax or pleural effusion.
IMPRESSION: Increased right effusion basilar airspace disease worrisome for
pneumonia.

## 2016-05-14 IMAGING — CR DG CHEST 2V
2 series · 2 of 2 positions shown · non-contrast
Comparison: 06/12/2014.

CLINICAL DATA: Lung cancer.  Shortness of breath he

EXAM:
CHEST  2 VIEW

[w chest pa]
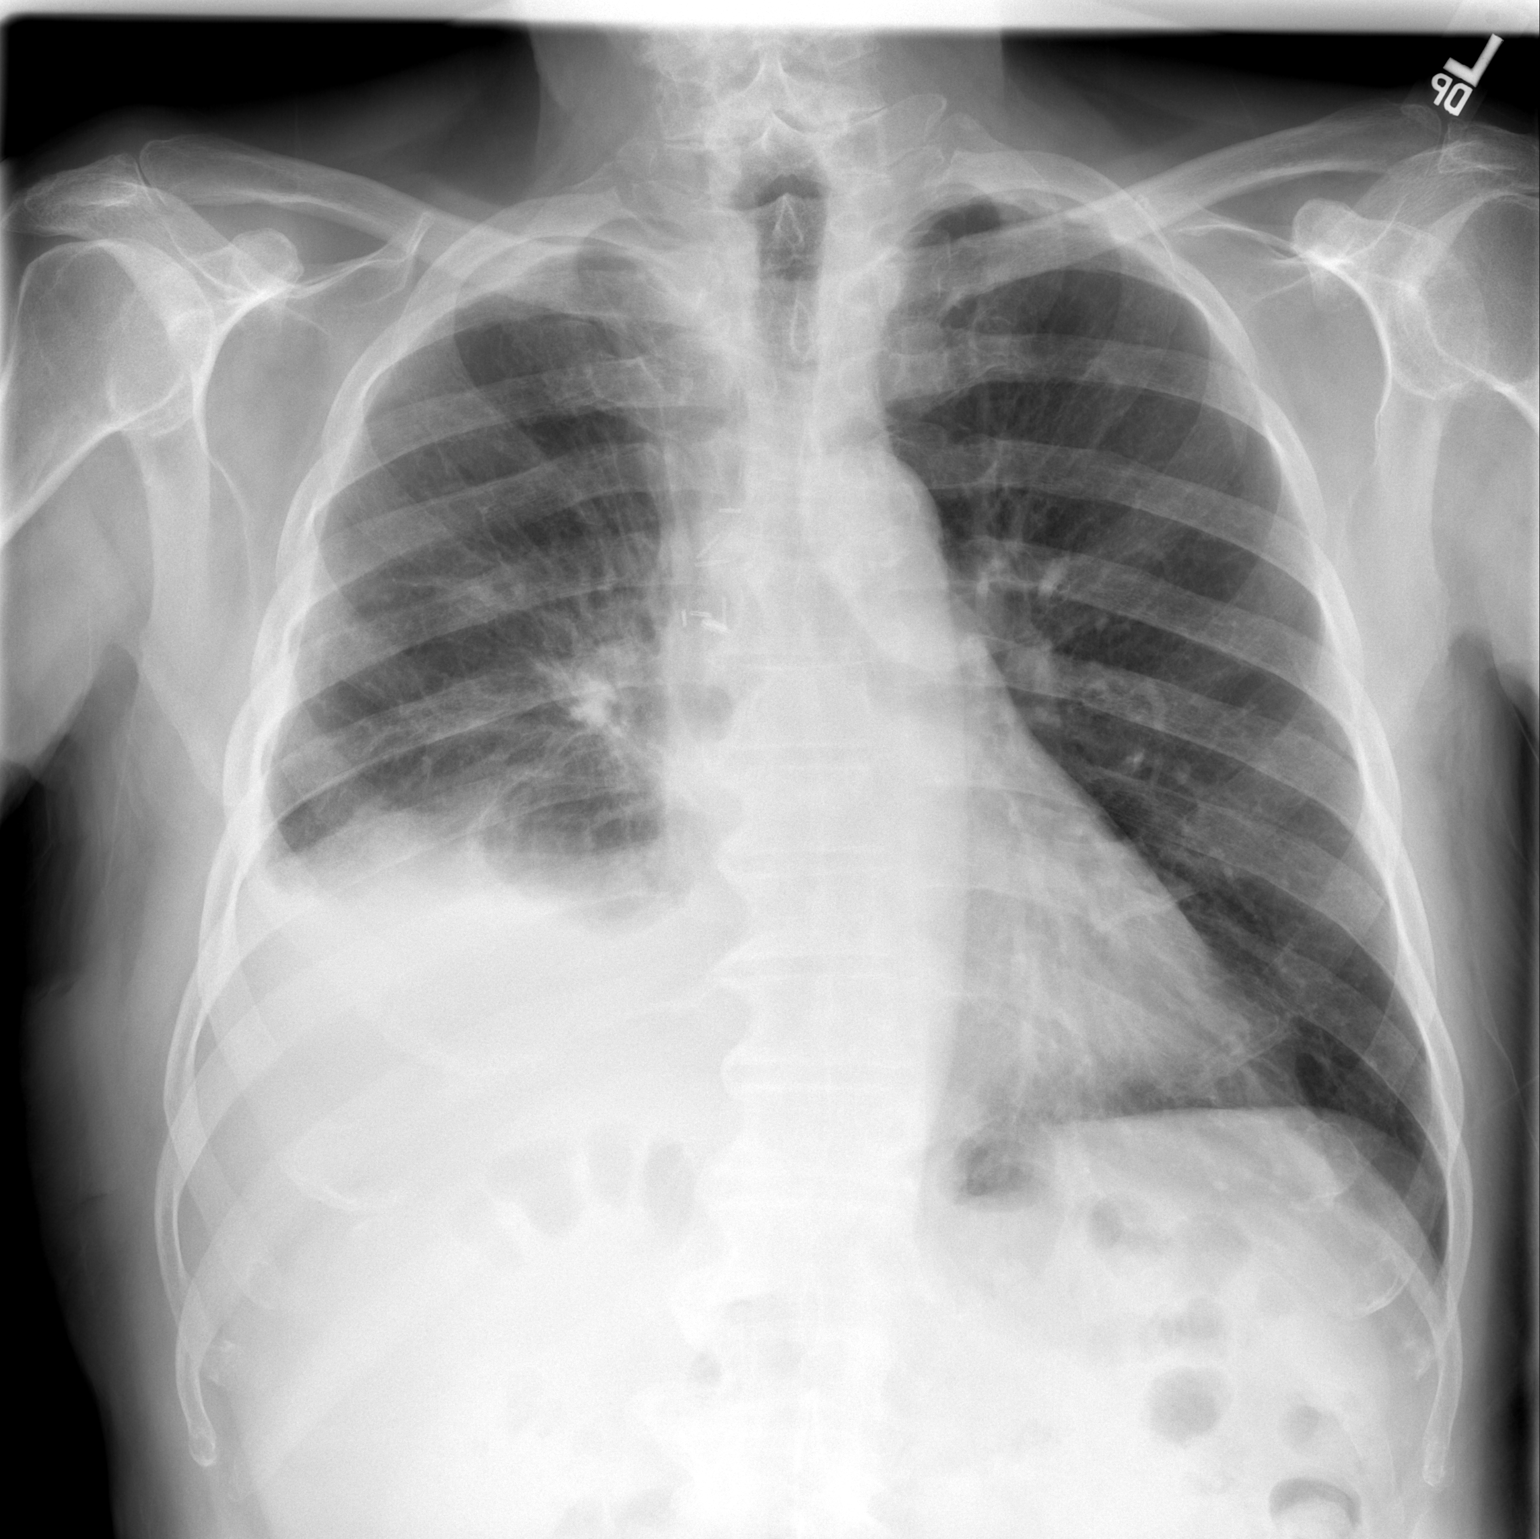

[w chest lat]
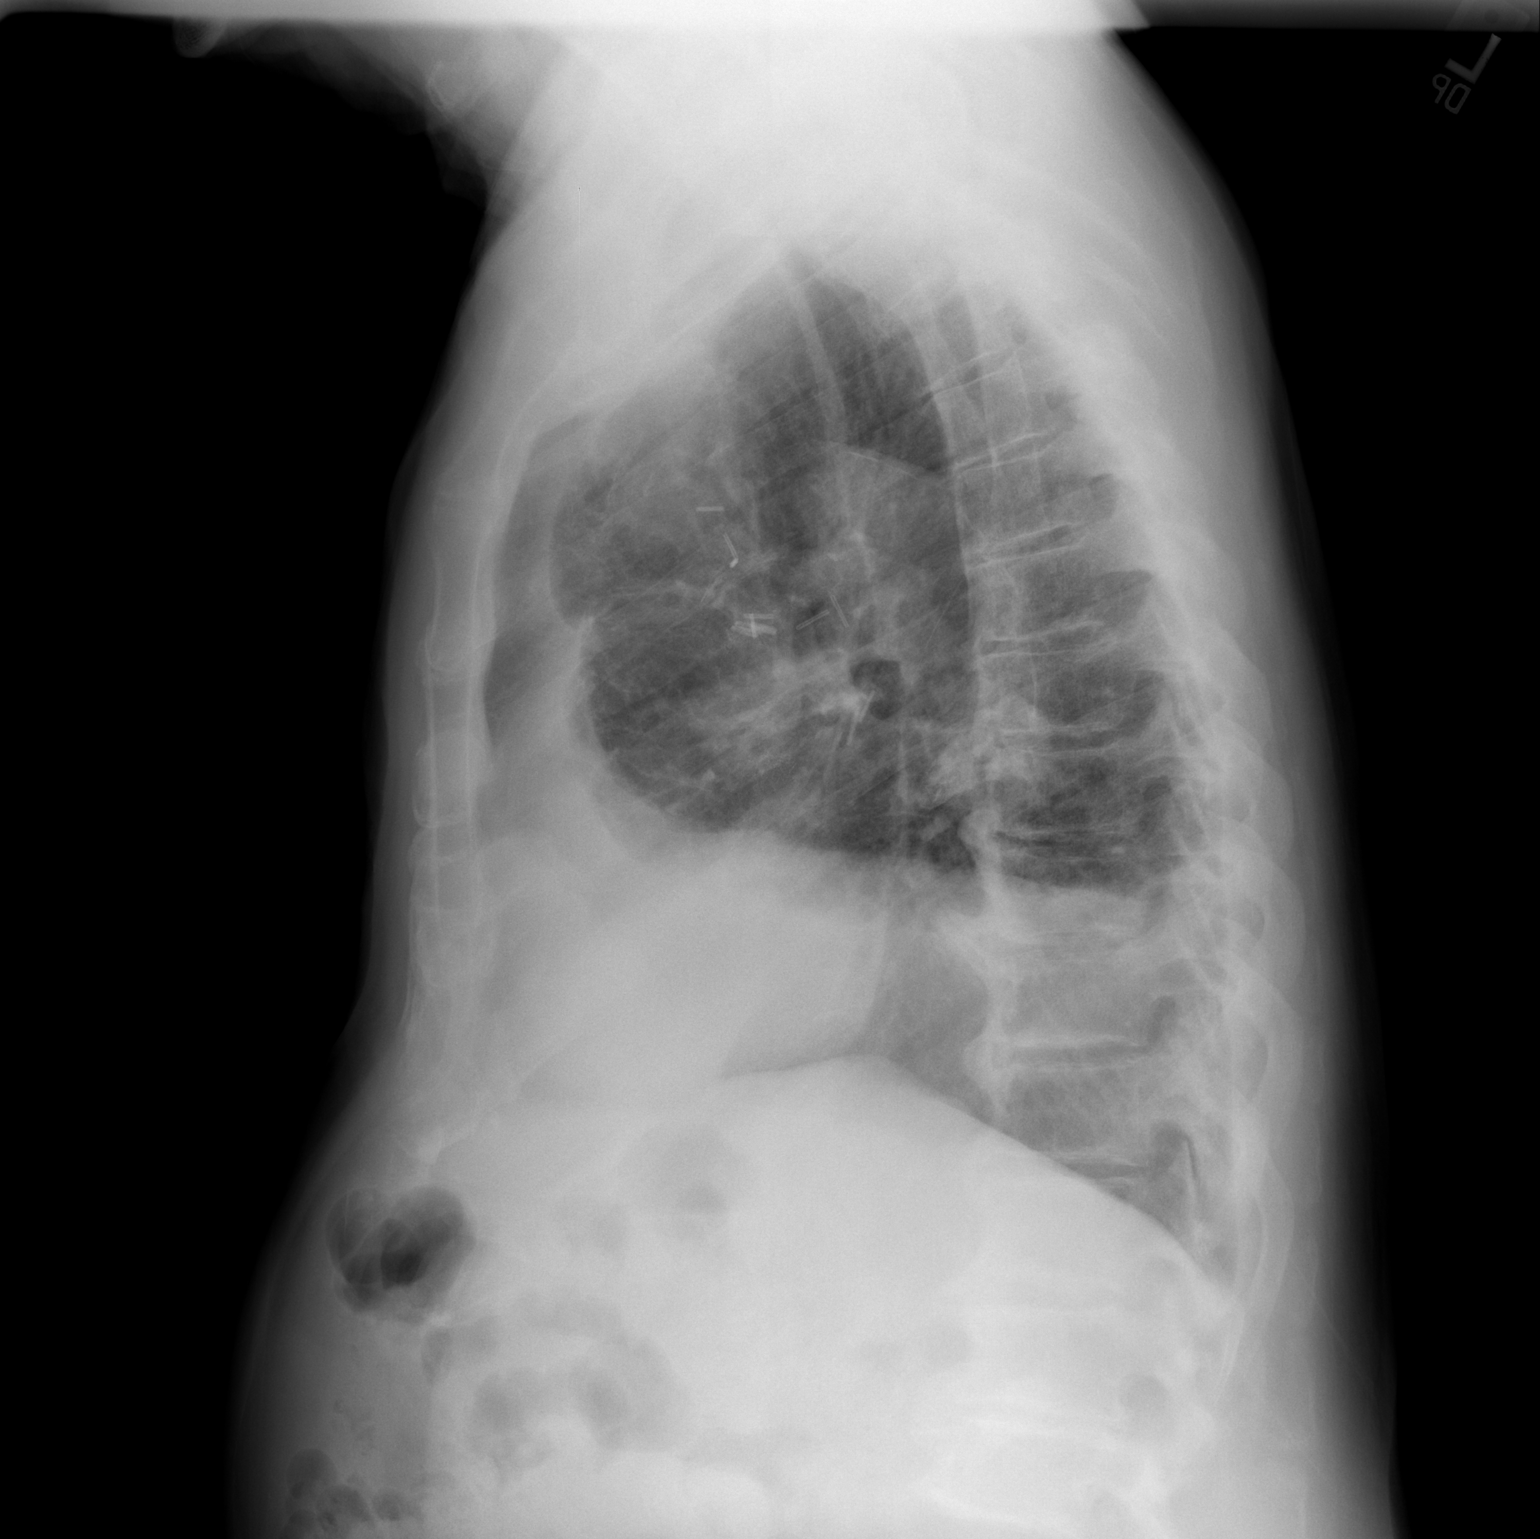

[2 of 2 positions shown; findings below may reference images not displayed]

FINDINGS: Mediastinum and hilar structures are stable. Surgical clips noted
over the right hilum. Right base atelectasis and right pleural
effusion noted P mild right perihilar infiltrate cannot be excluded.
Heart size is stable. No acute bony abnormality.
IMPRESSION: 1. Right perihilar mild infiltrate and right base atelectasis.
2. Right pleural effusion noted on today's exam no pneumothorax.
3. Postsurgical changes right perihilar region.
4. Stable cardiomegaly .
